# Patient Record
Sex: Female | Born: 1955 | Race: Black or African American | Hispanic: No | Marital: Married | Smoking: Current every day smoker
Health system: Southern US, Community
[De-identification: ages and names within clinical notes are randomized; demographics above are authoritative.]

## PROBLEM LIST (undated history)

## (undated) DIAGNOSIS — G4700 Insomnia, unspecified: Secondary | ICD-10-CM

## (undated) DIAGNOSIS — I739 Peripheral vascular disease, unspecified: Secondary | ICD-10-CM

## (undated) DIAGNOSIS — L409 Psoriasis, unspecified: Secondary | ICD-10-CM

## (undated) DIAGNOSIS — I10 Essential (primary) hypertension: Secondary | ICD-10-CM

## (undated) DIAGNOSIS — IMO0002 Reserved for concepts with insufficient information to code with codable children: Secondary | ICD-10-CM

## (undated) DIAGNOSIS — K219 Gastro-esophageal reflux disease without esophagitis: Secondary | ICD-10-CM

## (undated) DIAGNOSIS — K254 Chronic or unspecified gastric ulcer with hemorrhage: Secondary | ICD-10-CM

## (undated) DIAGNOSIS — E119 Type 2 diabetes mellitus without complications: Secondary | ICD-10-CM

## (undated) DIAGNOSIS — Z227 Latent tuberculosis: Secondary | ICD-10-CM

## (undated) DIAGNOSIS — N920 Excessive and frequent menstruation with regular cycle: Secondary | ICD-10-CM

## (undated) DIAGNOSIS — I639 Cerebral infarction, unspecified: Secondary | ICD-10-CM

## (undated) DIAGNOSIS — E785 Hyperlipidemia, unspecified: Secondary | ICD-10-CM

## (undated) DIAGNOSIS — D649 Anemia, unspecified: Secondary | ICD-10-CM

## (undated) DIAGNOSIS — M1990 Unspecified osteoarthritis, unspecified site: Secondary | ICD-10-CM

## (undated) HISTORY — DX: Reserved for concepts with insufficient information to code with codable children: IMO0002

## (undated) HISTORY — DX: Gastro-esophageal reflux disease without esophagitis: K21.9

## (undated) HISTORY — DX: Peripheral vascular disease, unspecified: I73.9

## (undated) HISTORY — DX: Psoriasis, unspecified: L40.9

## (undated) HISTORY — DX: Chronic or unspecified gastric ulcer with hemorrhage: K25.4

## (undated) HISTORY — DX: Insomnia, unspecified: G47.00

## (undated) HISTORY — PX: KIDNEY SURGERY: SHX687

## (undated) HISTORY — PX: TUBAL LIGATION: SHX77

## (undated) HISTORY — DX: Unspecified osteoarthritis, unspecified site: M19.90

## (undated) HISTORY — DX: Cerebral infarction, unspecified: I63.9

## (undated) HISTORY — DX: Latent tuberculosis: Z22.7

## (undated) HISTORY — DX: Hyperlipidemia, unspecified: E78.5

## (undated) HISTORY — DX: Excessive and frequent menstruation with regular cycle: N92.0

## (undated) HISTORY — DX: Anemia, unspecified: D64.9

---

## 1981-10-24 DIAGNOSIS — IMO0002 Reserved for concepts with insufficient information to code with codable children: Secondary | ICD-10-CM

## 1981-10-24 DIAGNOSIS — R87619 Unspecified abnormal cytological findings in specimens from cervix uteri: Secondary | ICD-10-CM

## 1981-10-24 HISTORY — DX: Reserved for concepts with insufficient information to code with codable children: IMO0002

## 1981-10-24 HISTORY — DX: Unspecified abnormal cytological findings in specimens from cervix uteri: R87.619

## 1998-11-13 ENCOUNTER — Other Ambulatory Visit: Admission: RE | Admit: 1998-11-13 | Discharge: 1998-11-13 | Payer: Self-pay | Admitting: Obstetrics and Gynecology

## 1998-11-24 ENCOUNTER — Encounter: Payer: Self-pay | Admitting: Obstetrics and Gynecology

## 1998-11-24 ENCOUNTER — Ambulatory Visit (HOSPITAL_COMMUNITY): Admission: RE | Admit: 1998-11-24 | Discharge: 1998-11-24 | Payer: Self-pay | Admitting: Obstetrics and Gynecology

## 2000-06-01 ENCOUNTER — Other Ambulatory Visit: Admission: RE | Admit: 2000-06-01 | Discharge: 2000-06-01 | Payer: Self-pay | Admitting: Obstetrics and Gynecology

## 2000-06-14 ENCOUNTER — Ambulatory Visit (HOSPITAL_COMMUNITY): Admission: RE | Admit: 2000-06-14 | Discharge: 2000-06-14 | Payer: Self-pay | Admitting: Obstetrics and Gynecology

## 2000-06-14 ENCOUNTER — Encounter: Payer: Self-pay | Admitting: Obstetrics and Gynecology

## 2000-06-29 ENCOUNTER — Encounter: Payer: Self-pay | Admitting: Obstetrics and Gynecology

## 2000-06-29 ENCOUNTER — Ambulatory Visit (HOSPITAL_COMMUNITY): Admission: RE | Admit: 2000-06-29 | Discharge: 2000-06-29 | Payer: Self-pay | Admitting: Obstetrics and Gynecology

## 2000-10-10 ENCOUNTER — Encounter: Payer: Self-pay | Admitting: Obstetrics and Gynecology

## 2000-10-10 ENCOUNTER — Ambulatory Visit (HOSPITAL_COMMUNITY): Admission: RE | Admit: 2000-10-10 | Discharge: 2000-10-10 | Payer: Self-pay | Admitting: Obstetrics and Gynecology

## 2000-11-26 ENCOUNTER — Encounter: Payer: Self-pay | Admitting: Emergency Medicine

## 2000-11-26 ENCOUNTER — Emergency Department (HOSPITAL_COMMUNITY): Admission: EM | Admit: 2000-11-26 | Discharge: 2000-11-26 | Payer: Self-pay | Admitting: Emergency Medicine

## 2000-11-30 ENCOUNTER — Ambulatory Visit (HOSPITAL_COMMUNITY): Admission: RE | Admit: 2000-11-30 | Discharge: 2000-11-30 | Payer: Self-pay | Admitting: Internal Medicine

## 2000-11-30 ENCOUNTER — Encounter (HOSPITAL_BASED_OUTPATIENT_CLINIC_OR_DEPARTMENT_OTHER): Payer: Self-pay | Admitting: Internal Medicine

## 2001-06-07 ENCOUNTER — Other Ambulatory Visit: Admission: RE | Admit: 2001-06-07 | Discharge: 2001-06-07 | Payer: Self-pay | Admitting: Obstetrics and Gynecology

## 2001-07-03 ENCOUNTER — Encounter: Payer: Self-pay | Admitting: Obstetrics and Gynecology

## 2001-07-03 ENCOUNTER — Ambulatory Visit (HOSPITAL_COMMUNITY): Admission: RE | Admit: 2001-07-03 | Discharge: 2001-07-03 | Payer: Self-pay | Admitting: Obstetrics and Gynecology

## 2002-08-13 ENCOUNTER — Other Ambulatory Visit: Admission: RE | Admit: 2002-08-13 | Discharge: 2002-08-13 | Payer: Self-pay | Admitting: Obstetrics and Gynecology

## 2002-08-20 ENCOUNTER — Encounter: Payer: Self-pay | Admitting: Obstetrics and Gynecology

## 2002-08-20 ENCOUNTER — Ambulatory Visit (HOSPITAL_COMMUNITY): Admission: RE | Admit: 2002-08-20 | Discharge: 2002-08-20 | Payer: Self-pay | Admitting: Obstetrics and Gynecology

## 2003-09-03 ENCOUNTER — Other Ambulatory Visit: Admission: RE | Admit: 2003-09-03 | Discharge: 2003-09-03 | Payer: Self-pay | Admitting: Obstetrics and Gynecology

## 2003-09-09 ENCOUNTER — Ambulatory Visit (HOSPITAL_COMMUNITY): Admission: RE | Admit: 2003-09-09 | Discharge: 2003-09-09 | Payer: Self-pay | Admitting: Obstetrics and Gynecology

## 2004-09-08 ENCOUNTER — Other Ambulatory Visit: Admission: RE | Admit: 2004-09-08 | Discharge: 2004-09-08 | Payer: Self-pay | Admitting: Obstetrics and Gynecology

## 2004-09-27 ENCOUNTER — Ambulatory Visit (HOSPITAL_COMMUNITY): Admission: RE | Admit: 2004-09-27 | Discharge: 2004-09-27 | Payer: Self-pay | Admitting: Obstetrics and Gynecology

## 2005-08-11 ENCOUNTER — Other Ambulatory Visit: Admission: RE | Admit: 2005-08-11 | Discharge: 2005-08-11 | Payer: Self-pay | Admitting: Obstetrics and Gynecology

## 2005-10-24 HISTORY — PX: COLONOSCOPY: SHX174

## 2005-11-01 ENCOUNTER — Ambulatory Visit (HOSPITAL_COMMUNITY): Admission: RE | Admit: 2005-11-01 | Discharge: 2005-11-01 | Payer: Self-pay | Admitting: Obstetrics and Gynecology

## 2005-11-29 ENCOUNTER — Emergency Department (HOSPITAL_COMMUNITY): Admission: EM | Admit: 2005-11-29 | Discharge: 2005-11-30 | Payer: Self-pay | Admitting: Emergency Medicine

## 2005-12-01 ENCOUNTER — Ambulatory Visit: Payer: Self-pay | Admitting: Internal Medicine

## 2005-12-05 ENCOUNTER — Ambulatory Visit: Payer: Self-pay | Admitting: Internal Medicine

## 2005-12-05 ENCOUNTER — Encounter (INDEPENDENT_AMBULATORY_CARE_PROVIDER_SITE_OTHER): Payer: Self-pay | Admitting: *Deleted

## 2005-12-05 DIAGNOSIS — K648 Other hemorrhoids: Secondary | ICD-10-CM | POA: Insufficient documentation

## 2006-01-06 ENCOUNTER — Ambulatory Visit: Payer: Self-pay | Admitting: Internal Medicine

## 2006-11-28 ENCOUNTER — Ambulatory Visit (HOSPITAL_COMMUNITY): Admission: RE | Admit: 2006-11-28 | Discharge: 2006-11-28 | Payer: Self-pay | Admitting: Obstetrics and Gynecology

## 2006-11-29 ENCOUNTER — Ambulatory Visit (HOSPITAL_COMMUNITY): Admission: RE | Admit: 2006-11-29 | Discharge: 2006-11-29 | Payer: Self-pay | Admitting: Obstetrics and Gynecology

## 2007-08-22 ENCOUNTER — Observation Stay (HOSPITAL_COMMUNITY): Admission: EM | Admit: 2007-08-22 | Discharge: 2007-08-23 | Payer: Self-pay | Admitting: Emergency Medicine

## 2008-01-07 ENCOUNTER — Ambulatory Visit (HOSPITAL_COMMUNITY): Admission: RE | Admit: 2008-01-07 | Discharge: 2008-01-07 | Payer: Self-pay | Admitting: Obstetrics and Gynecology

## 2008-03-28 ENCOUNTER — Encounter: Payer: Self-pay | Admitting: Internal Medicine

## 2008-03-28 LAB — CONVERTED CEMR LAB
ALT: 7 units/L
AST: 13 units/L
BUN: 16 mg/dL
HCT: 27 %
Hemoglobin: 8.8 g/dL
MCV: 79 fL
Platelets: 432 10*3/uL
WBC: 5.9 10*3/uL

## 2008-04-03 ENCOUNTER — Encounter: Payer: Self-pay | Admitting: Internal Medicine

## 2008-04-03 LAB — CONVERTED CEMR LAB: Hemoglobin: 8.8 g/dL

## 2008-05-01 ENCOUNTER — Ambulatory Visit: Payer: Self-pay | Admitting: Internal Medicine

## 2008-05-01 DIAGNOSIS — D509 Iron deficiency anemia, unspecified: Secondary | ICD-10-CM | POA: Insufficient documentation

## 2008-05-06 ENCOUNTER — Telehealth: Payer: Self-pay | Admitting: Internal Medicine

## 2008-05-08 ENCOUNTER — Ambulatory Visit: Payer: Self-pay | Admitting: Internal Medicine

## 2009-01-14 ENCOUNTER — Ambulatory Visit (HOSPITAL_COMMUNITY): Admission: RE | Admit: 2009-01-14 | Discharge: 2009-01-14 | Payer: Self-pay | Admitting: Obstetrics and Gynecology

## 2010-01-07 ENCOUNTER — Ambulatory Visit: Payer: Self-pay | Admitting: Vascular Surgery

## 2010-01-28 ENCOUNTER — Ambulatory Visit (HOSPITAL_COMMUNITY): Admission: RE | Admit: 2010-01-28 | Discharge: 2010-01-28 | Payer: Self-pay | Admitting: Obstetrics and Gynecology

## 2011-01-22 ENCOUNTER — Other Ambulatory Visit: Payer: Self-pay | Admitting: Family Medicine

## 2011-02-03 ENCOUNTER — Other Ambulatory Visit (HOSPITAL_COMMUNITY): Payer: Self-pay | Admitting: Obstetrics and Gynecology

## 2011-02-03 DIAGNOSIS — Z1231 Encounter for screening mammogram for malignant neoplasm of breast: Secondary | ICD-10-CM

## 2011-02-09 ENCOUNTER — Ambulatory Visit (HOSPITAL_COMMUNITY)
Admission: RE | Admit: 2011-02-09 | Discharge: 2011-02-09 | Disposition: A | Discharge status: Home / Self Care | Payer: BLUE CROSS/BLUE SHIELD | Source: Ambulatory Visit | Attending: Obstetrics and Gynecology | Admitting: Obstetrics and Gynecology

## 2011-02-09 DIAGNOSIS — Z1231 Encounter for screening mammogram for malignant neoplasm of breast: Secondary | ICD-10-CM | POA: Insufficient documentation

## 2011-03-08 NOTE — Procedures (Signed)
DUPLEX DEEP VENOUS EXAM - LOWER EXTREMITY   INDICATION:  Left lower extremity pain.   HISTORY:  Edema:  Yes.  Trauma/Surgery:  No.  Pain:  No.  PE:  Previous DVT:  No.  Anticoagulants:  Heparin.  Other:   DUPLEX EXAM:                CFV   SFV   PopV  PTV    GSV                R  L  R  L  R  L  R   L  R  L  Thrombosis    o  o     o     o      o     o  Spontaneous   +  +     +     +      +     +  Phasic        +  +     +     +      +     +  Augmentation  +  +     +     +      +     +  Compressible  +  +     +     +      +     +  Competent     +  +     +     +      +     +   Legend:  + - yes  o - no  p - partial  D - decreased   IMPRESSION:  No evidence of deep venous thrombosis identified.  Noncompressible mid small saphenous vein with partial blood flow  identified.    _____________________________  Kristi Avery. Edilia Bo, M.D.   CJ/MEDQ  D:  01/07/2010  T:  01/07/2010  Job:  [id number]

## 2011-03-08 NOTE — Discharge Summary (Signed)
NAME:  Kristi Avery, Kristi Avery                  ACCOUNT NO.:  [id number]   MEDICAL RECORD NO.:  [id number]          PATIENT TYPE:  INP   LOCATION:  4731                         FACILITY:  [facility]   PHYSICIAN:  Vesta Mixer, M.D. DATE OF BIRTH:  1956-02-11   DATE OF ADMISSION:  08/22/2007  DATE OF DISCHARGE:  08/23/2007                               DISCHARGE SUMMARY   DISCHARGE DIAGNOSES:  1. Chest pain - most likely noncardiac.  2. Diabetes mellitus.  3. Hyperlipidemia.  4. Hypertension.   DISCHARGE MEDICATIONS:  1. Enteric-coated aspirin 81 mg a day.  2 . Avandia 4 mg a day.  1. Glucotrol XL 10 mg a day.  2. Simvastatin 20 mg a day.  3. Monopril 40 mg a day.  4. Hydrochlorothiazide 25 mg a day.  5. Prilosec over-the-counter once a day.  6. Metformin 500 mg a day to start on Friday.  7. Nitroglycerin 0.4 mg as needed for chest pain.   DISPOSITION:  The patient will see Dr. Elease Hashimoto in the office in 1-2  weeks.  We will schedule her for either outpatient repeat Cardiolite  study versus heart catheterization depending on her symptoms.  She had  been instructed to call me for further episodes of chest pain.   HISTORY:  [name] is a 55 year old female with a long history of  diabetes mellitus, hypertension, hyperlipidemia.  She is admitted  through the emergency room after having episodes of chest pain.   HOSPITAL COURSE:  Per problems.  #1 - CHEST PAIN.  The patient presented with some typical and some  atypical episodes of chest pain.  She has sharp episodes of chest pain  that only last for a second or so.  She has had occasional episodes of  chest tightness that last for a little bit longer time.  She is somewhat  vague.  It sounds like some of these certainly could be angina.  She had  no EKG changes.  She ruled out for myocardial infarction with serial CPK  and troponin levels.  The patient is quite stable and would like to go  home.  We will discharge her to home today.  We have  added  nitroglycerin.  We have held her Glucophage, but we will restart that  tomorrow.  The patient had a spiral CT which was reportedly  negative, although I am not able to find the results on the computer.  We will follow up with this as an outpatient.  #2 - Hyper lipidemia - stable.  #3 - Diabetes mellitus - stable.           ______________________________  Vesta Mixer, M.D.     PJN/MEDQ  D:  08/23/2007  T:  08/23/2007  Job:  [id number]   cc:   Gwen Pounds, MD

## 2011-03-08 NOTE — H&P (Signed)
NAME:  Kristi, Avery                  ACCOUNT NO.:  [id number]   MEDICAL RECORD NO.:  [id number]          PATIENT TYPE:  INP   LOCATION:  4731                         FACILITY:  [facility]   PHYSICIAN:  Vesta Mixer, M.D. DATE OF BIRTH:  [birth year]   DATE OF ADMISSION:  08/22/2007  DATE OF DISCHARGE:                              HISTORY & PHYSICAL   HISTORY OF PRESENT ILLNESS:  Kristi Avery is a 55 year old female with a  history of cigarette smoking, chest pain, diabetes mellitus and  gastroesophageal reflux disease.  She is admitted to the hospital with  episodes of chest pain.   The patient has been seen in our office many years ago.  She had a  stress test approximately 5 years ago which was unremarkable.   The patient has overall done fairly well.  She started having some  episodes of chest pain early this morning.  The chest pain started  originally as a left arm pain and then radiated to her chest.  The  episode lasted about 30 seconds.  It then resolved.  She was up walking  around and she had chest tightness with exertion.  The chest tightness  gradually resolved as she rested.   The patient recalls having some episodes of chest pain over the past  several weeks which were not all that similar to this episode.  The  patient came to the emergency room and received nitroglycerin.  The  chest pain was promptly relieved with sublingual nitroglycerin.   CURRENT MEDICATIONS:  1. Avandia 4 mg a day.  2. Glucotrol XL 10 mg a day.  3. Glucophage 500 mg p.o. b.i.d.  4. Zocor 20 mg a day.  5. Fosinopril 40 mg a day.  6. HCTZ 25 mg a day.  7. Protonix 40 mg a day.   ALLERGIES:  No known drug allergies.   PAST MEDICAL HISTORY:  1. Diabetes mellitus.  2. Chest pain.  3. Hypertension.  4. Hyperlipidemia.   SOCIAL HISTORY:  The patient smokes about a half pack of cigarettes a  day.  She drinks alcohol occasionally.  She does not get any regular  exercise.   FAMILY HISTORY:   Unremarkable.   REVIEW OF SYSTEMS:  Reviewed and is essentially negative except for as  noted in the HPI.   PHYSICAL EXAMINATION:  GENERAL:  She is a middle-aged female in no acute  distress.  She is alert and oriented x3 and her mood and affect are  normal.  VITAL SIGNS:  Blood pressure is 151/66 with heart rate 75.  NECK:  2+ carotids.  She has no bruits, no JVD, no thyromegaly.  LUNGS:  Clear to auscultation.  HEART:  Regular rate, S1, S2.  ABDOMEN:  Good bowel sounds, nontender.  EXTREMITIES:  She has no clubbing, cyanosis or edema.  NEUROLOGIC:  Nonfocal.   LABORATORY DATA AND X-RAY FINDINGS:  The patient's initial cardiac  enzymes are negative.  Her initial labs are otherwise unremarkable.  She  has a D-dimer of 0.55.  The spiral CT of the lungs is pending.  Creatinine is  1.5.   ASSESSMENT/PLAN:  Kristi Avery presents with some episodes of chest pain.  She has some atypical and typical symptoms of angina.  We will continue  to collect cardiac enzymes.  I would have a low threshold to perform  heart catheterization since she does have exertional chest tightness.  We will discuss these results more with the patient tonight.  All of her  other medical problems remain stable.           ______________________________  Vesta Mixer, M.D.     PJN/MEDQ  D:  08/22/2007  T:  08/23/2007  Job:  [id number]   cc:   Gwen Pounds, MD

## 2011-03-17 ENCOUNTER — Emergency Department (HOSPITAL_COMMUNITY): Payer: BC Managed Care – PPO

## 2011-03-17 ENCOUNTER — Emergency Department (HOSPITAL_COMMUNITY)
Admission: EM | Admit: 2011-03-17 | Discharge: 2011-03-18 | Disposition: A | Discharge status: Home / Self Care | Payer: BC Managed Care – PPO | Attending: Emergency Medicine | Admitting: Emergency Medicine

## 2011-03-17 DIAGNOSIS — K219 Gastro-esophageal reflux disease without esophagitis: Secondary | ICD-10-CM | POA: Insufficient documentation

## 2011-03-17 DIAGNOSIS — R0789 Other chest pain: Secondary | ICD-10-CM | POA: Insufficient documentation

## 2011-03-17 DIAGNOSIS — E119 Type 2 diabetes mellitus without complications: Secondary | ICD-10-CM | POA: Insufficient documentation

## 2011-03-17 DIAGNOSIS — R51 Headache: Secondary | ICD-10-CM | POA: Insufficient documentation

## 2011-03-17 DIAGNOSIS — I10 Essential (primary) hypertension: Secondary | ICD-10-CM | POA: Insufficient documentation

## 2011-03-17 LAB — GLUCOSE, CAPILLARY: Glucose-Capillary: 129 mg/dL — ABNORMAL HIGH (ref 70–99)

## 2011-03-18 LAB — CK TOTAL AND CKMB (NOT AT ARMC)
CK, MB: 1.3 ng/mL (ref 0.3–4.0)
Relative Index: INVALID (ref 0.0–2.5)
Total CK: 53 U/L (ref 7–177)

## 2011-03-18 LAB — DIFFERENTIAL: Basophils Relative: 1 % (ref 0–1)

## 2011-03-18 LAB — CBC
HCT: 33.9 % — ABNORMAL LOW (ref 36.0–46.0)
Hemoglobin: 12 g/dL (ref 12.0–15.0)
MCH: 30.8 pg (ref 26.0–34.0)
MCV: 87.1 fL (ref 78.0–100.0)
RBC: 3.89 MIL/uL (ref 3.87–5.11)
WBC: 7 10*3/uL (ref 4.0–10.5)

## 2011-03-18 LAB — BASIC METABOLIC PANEL
CO2: 24 mEq/L (ref 19–32)
Calcium: 9.3 mg/dL (ref 8.4–10.5)
Chloride: 103 mEq/L (ref 96–112)
GFR calc Af Amer: 53 mL/min — ABNORMAL LOW (ref >60)
GFR calc non Af Amer: 43 mL/min — ABNORMAL LOW (ref >60)
Potassium: 4.2 mEq/L (ref 3.5–5.1)
Sodium: 136 mEq/L (ref 135–145)

## 2011-03-18 LAB — TROPONIN I: Troponin I: <0.3 ng/mL (ref <0.30)

## 2011-04-01 ENCOUNTER — Other Ambulatory Visit: Payer: Self-pay | Admitting: Family Medicine

## 2011-08-03 LAB — I-STAT 8, (EC8 V) (CONVERTED LAB)
Acid-base deficit: 4 — ABNORMAL HIGH
BUN: 23
Bicarbonate: 22.5
Chloride: 109
Glucose, Bld: 105 — ABNORMAL HIGH
HCT: 35 — ABNORMAL LOW
Hemoglobin: 11.9 — ABNORMAL LOW
Operator id: 198171
Potassium: 4.8
Sodium: 138
TCO2: 24
pCO2, Ven: 47.1
pH, Ven: 7.288

## 2011-08-03 LAB — DIFFERENTIAL
Basophils Absolute: 0.1
Basophils Relative: 1
Eosinophils Absolute: 0.1
Eosinophils Relative: 1
Lymphocytes Relative: 41
Lymphs Abs: 2.3
Monocytes Absolute: 0.4
Monocytes Relative: 8
Neutro Abs: 2.8
Neutrophils Relative %: 49

## 2011-08-03 LAB — CARDIAC PANEL(CRET KIN+CKTOT+MB+TROPI)
CK, MB: 0.8
CK, MB: 1.1
Total CK: 51
Troponin I: 0.01
Troponin I: 0.02

## 2011-08-03 LAB — POCT I-STAT CREATININE
Creatinine, Ser: 1.5 — ABNORMAL HIGH
Operator id: 198171

## 2011-08-03 LAB — COMPREHENSIVE METABOLIC PANEL
AST: 12
Albumin: 3.4 — ABNORMAL LOW
Calcium: 8.7
Chloride: 107
Creatinine, Ser: 1.26 — ABNORMAL HIGH
GFR calc Af Amer: 54 — ABNORMAL LOW
Total Bilirubin: 0.4

## 2011-08-03 LAB — CBC
HCT: 33.7 — ABNORMAL LOW
MCV: 87.2
RBC: 3.87
WBC: 5.6

## 2011-08-03 LAB — POCT CARDIAC MARKERS
CKMB, poc: <1 — ABNORMAL LOW
Myoglobin, poc: 42.6
Myoglobin, poc: 64.9
Operator id: 198171
Operator id: 198171
Troponin i, poc: <0.05
Troponin i, poc: <0.05

## 2011-08-03 LAB — D-DIMER, QUANTITATIVE: D-Dimer, Quant: 0.55 — ABNORMAL HIGH

## 2012-02-08 ENCOUNTER — Ambulatory Visit: Payer: Self-pay | Admitting: Obstetrics and Gynecology

## 2012-02-28 ENCOUNTER — Ambulatory Visit: Payer: Self-pay | Admitting: Obstetrics and Gynecology

## 2012-02-29 ENCOUNTER — Other Ambulatory Visit: Payer: Self-pay | Admitting: Obstetrics and Gynecology

## 2012-02-29 DIAGNOSIS — Z1231 Encounter for screening mammogram for malignant neoplasm of breast: Secondary | ICD-10-CM

## 2012-03-26 ENCOUNTER — Encounter: Payer: Self-pay | Admitting: Obstetrics and Gynecology

## 2012-03-28 ENCOUNTER — Encounter: Payer: Self-pay | Admitting: Obstetrics and Gynecology

## 2012-03-28 ENCOUNTER — Ambulatory Visit (HOSPITAL_COMMUNITY): Payer: BC Managed Care – PPO

## 2012-03-28 ENCOUNTER — Ambulatory Visit (INDEPENDENT_AMBULATORY_CARE_PROVIDER_SITE_OTHER): Payer: BC Managed Care – PPO | Admitting: Obstetrics and Gynecology

## 2012-03-28 VITALS — BP 122/64 | Resp 16 | Ht 61.0 in | Wt 175.0 lb

## 2012-03-28 DIAGNOSIS — I10 Essential (primary) hypertension: Secondary | ICD-10-CM | POA: Insufficient documentation

## 2012-03-28 DIAGNOSIS — L408 Other psoriasis: Secondary | ICD-10-CM

## 2012-03-28 DIAGNOSIS — E119 Type 2 diabetes mellitus without complications: Secondary | ICD-10-CM | POA: Insufficient documentation

## 2012-03-28 DIAGNOSIS — L409 Psoriasis, unspecified: Secondary | ICD-10-CM | POA: Insufficient documentation

## 2012-03-28 DIAGNOSIS — D219 Benign neoplasm of connective and other soft tissue, unspecified: Secondary | ICD-10-CM | POA: Insufficient documentation

## 2012-03-28 DIAGNOSIS — N951 Menopausal and female climacteric states: Secondary | ICD-10-CM

## 2012-03-28 DIAGNOSIS — Z01419 Encounter for gynecological examination (general) (routine) without abnormal findings: Secondary | ICD-10-CM

## 2012-03-28 DIAGNOSIS — D259 Leiomyoma of uterus, unspecified: Secondary | ICD-10-CM

## 2012-03-28 DIAGNOSIS — K219 Gastro-esophageal reflux disease without esophagitis: Secondary | ICD-10-CM | POA: Insufficient documentation

## 2012-03-28 DIAGNOSIS — G4700 Insomnia, unspecified: Secondary | ICD-10-CM

## 2012-03-28 DIAGNOSIS — E785 Hyperlipidemia, unspecified: Secondary | ICD-10-CM | POA: Insufficient documentation

## 2012-03-28 MED ORDER — CLOBETASOL PROPIONATE 0.05 % EX CREA: TOPICAL_CREAM | CUTANEOUS | Status: DC

## 2012-03-28 MED ORDER — TRIAMCINOLONE ACETONIDE 0.1 % EX CREA: TOPICAL_CREAM | CUTANEOUS | Status: DC

## 2012-03-28 NOTE — Progress Notes (Signed)
Last Pap: per pt 2012 , but pap in chart is dated 2011  WNL: Yes Regular Periods:no Contraception: Per pt None  Monthly Breast exam:yes Tetanus<46yrs:yes Nl.Bladder Function:yes Daily BMs:yes Healthy Diet:yes Calcium:yes Mammogram:yes Last mammogram 02/09/2011 Mammogram is set up for 04/09/12 Exercise:no Seatbelt: yes Abuse at home: no Stressful work:no Sigmoid-colonoscopy: per pt 5 years ago "2008" Bone Density: No  Subjective:    Kristi Avery is a 56 y.o. female G2P2002 who presents for annual exam.  She has a new diagnosis of psoriasis, for which she has been recommended methotrexate that is undecided about that therapy. She is currently using topical clobetasol and triamcinolone with some relief of itching. She states that her diabetes has been well controlled.  The following portions of the patient's history were reviewed and updated as appropriate: allergies, current medications, past family history, past medical history, past social history, past surgical history and problem list.  Review of Systems Pertinent items are noted in HPI. Gastrointestinal:No change in bowel habits, no abdominal pain, no rectal bleeding Genitourinary:negative for dysuria, frequency, hematuria, nocturia and urinary incontinence    Objective:     BP 122/64  Resp 16  Ht 5\' 1"  (1.549 m)  Wt 175 lb (79.379 kg)  BMI 33.07 kg/m2  Weight:  Wt Readings from Last 1 Encounters:  03/28/12 175 lb (79.379 kg)     BMI: Body mass index is 33.07 kg/(m^2). General Appearance: Alert, appropriate appearance for age. No acute distress. Psoriatic lesions over the lower extremities are varying sizes HEENT: Grossly normal Neck / Thyroid: Supple, no masses, nodes or enlargement Lungs: clear to auscultation bilaterally Back: No CVA tenderness Breast Exam: No masses or nodes.No dimpling, nipple retraction or discharge. Cardiovascular: Regular rate and rhythm. S1, S2, no murmur Gastrointestinal: Soft, non-tender,  no masses or organomegaly Pelvic Exam: External genitalia: normal general appearance Vaginal: normal mucosa without prolapse or lesions Cervix: normal appearance Adnexa: no separate masses Uterus: upper limits of normal size mobile and nontender Rectovaginal: normal rectal, no masses Lymphatic Exam: Non-palpable nodes in neck, clavicular, axillary, or inguinal regions Skin: see above Neurologic: Normal gait and speech, no tremor  Psychiatric: Alert and oriented, appropriate affect.    Urinalysis:Not done      Assessment:    menopausal vasomotor symptoms have recurred with the warm whether  Staple asymptomatic uterine fibroid New diagnosis of psoriasis primarily on the lower extremity   Plan:   Continue current regimen of clobetasol and triamcinolone Followup as scheduled with her dermatologist in the fall to determine whether she will use methotrexate therapy Pap smear is due in 2014 Follow-up:  for annual exam

## 2012-04-09 ENCOUNTER — Ambulatory Visit (HOSPITAL_COMMUNITY)
Admission: RE | Admit: 2012-04-09 | Discharge: 2012-04-09 | Disposition: A | Discharge status: Home / Self Care | Payer: BC Managed Care – PPO | Source: Ambulatory Visit | Attending: Obstetrics and Gynecology | Admitting: Obstetrics and Gynecology

## 2012-04-09 DIAGNOSIS — Z1231 Encounter for screening mammogram for malignant neoplasm of breast: Secondary | ICD-10-CM | POA: Insufficient documentation

## 2012-04-11 ENCOUNTER — Other Ambulatory Visit: Payer: Self-pay | Admitting: Obstetrics and Gynecology

## 2012-04-11 DIAGNOSIS — R928 Other abnormal and inconclusive findings on diagnostic imaging of breast: Secondary | ICD-10-CM

## 2012-04-17 ENCOUNTER — Ambulatory Visit
Admission: RE | Admit: 2012-04-17 | Discharge: 2012-04-17 | Disposition: A | Discharge status: Home / Self Care | Payer: BC Managed Care – PPO | Source: Ambulatory Visit | Attending: Obstetrics and Gynecology | Admitting: Obstetrics and Gynecology

## 2012-04-17 DIAGNOSIS — R928 Other abnormal and inconclusive findings on diagnostic imaging of breast: Secondary | ICD-10-CM

## 2012-05-15 ENCOUNTER — Other Ambulatory Visit: Payer: Self-pay | Admitting: Family Medicine

## 2012-05-15 ENCOUNTER — Ambulatory Visit
Admission: RE | Admit: 2012-05-15 | Discharge: 2012-05-15 | Disposition: A | Discharge status: Home / Self Care | Payer: No Typology Code available for payment source | Source: Ambulatory Visit | Attending: Family Medicine | Admitting: Family Medicine

## 2012-05-15 DIAGNOSIS — L409 Psoriasis, unspecified: Secondary | ICD-10-CM

## 2012-05-17 ENCOUNTER — Encounter: Payer: Self-pay | Admitting: Obstetrics and Gynecology

## 2012-08-31 ENCOUNTER — Encounter (HOSPITAL_COMMUNITY): Payer: Self-pay | Admitting: Emergency Medicine

## 2012-08-31 ENCOUNTER — Inpatient Hospital Stay (HOSPITAL_COMMUNITY)
Admission: EM | Admit: 2012-08-31 | Discharge: 2012-09-02 | DRG: 175 | Disposition: A | Discharge status: Home / Self Care | Payer: BC Managed Care – PPO | Attending: Gastroenterology | Admitting: Gastroenterology

## 2012-08-31 DIAGNOSIS — Z791 Long term (current) use of non-steroidal anti-inflammatories (NSAID): Secondary | ICD-10-CM

## 2012-08-31 DIAGNOSIS — K2960 Other gastritis without bleeding: Secondary | ICD-10-CM | POA: Diagnosis present

## 2012-08-31 DIAGNOSIS — K219 Gastro-esophageal reflux disease without esophagitis: Secondary | ICD-10-CM | POA: Diagnosis present

## 2012-08-31 DIAGNOSIS — K920 Hematemesis: Secondary | ICD-10-CM

## 2012-08-31 DIAGNOSIS — F17200 Nicotine dependence, unspecified, uncomplicated: Secondary | ICD-10-CM | POA: Diagnosis present

## 2012-08-31 DIAGNOSIS — G4700 Insomnia, unspecified: Secondary | ICD-10-CM | POA: Diagnosis present

## 2012-08-31 DIAGNOSIS — K254 Chronic or unspecified gastric ulcer with hemorrhage: Secondary | ICD-10-CM | POA: Diagnosis present

## 2012-08-31 DIAGNOSIS — L408 Other psoriasis: Secondary | ICD-10-CM | POA: Diagnosis present

## 2012-08-31 DIAGNOSIS — I959 Hypotension, unspecified: Secondary | ICD-10-CM

## 2012-08-31 DIAGNOSIS — Z79899 Other long term (current) drug therapy: Secondary | ICD-10-CM

## 2012-08-31 DIAGNOSIS — D62 Acute posthemorrhagic anemia: Secondary | ICD-10-CM | POA: Diagnosis present

## 2012-08-31 DIAGNOSIS — K921 Melena: Secondary | ICD-10-CM | POA: Diagnosis present

## 2012-08-31 DIAGNOSIS — R109 Unspecified abdominal pain: Secondary | ICD-10-CM

## 2012-08-31 DIAGNOSIS — E119 Type 2 diabetes mellitus without complications: Secondary | ICD-10-CM | POA: Diagnosis present

## 2012-08-31 DIAGNOSIS — K250 Acute gastric ulcer with hemorrhage: Principal | ICD-10-CM | POA: Diagnosis present

## 2012-08-31 DIAGNOSIS — I10 Essential (primary) hypertension: Secondary | ICD-10-CM | POA: Diagnosis present

## 2012-08-31 HISTORY — DX: Type 2 diabetes mellitus without complications: E11.9

## 2012-08-31 HISTORY — DX: Essential (primary) hypertension: I10

## 2012-08-31 LAB — CBC
HCT: 18.5 % — ABNORMAL LOW (ref 36.0–46.0)
Hemoglobin: 6.5 g/dL — CL (ref 12.0–15.0)
MCH: 30.7 pg (ref 26.0–34.0)
MCHC: 35.1 g/dL (ref 30.0–36.0)
MCV: 87.3 fL (ref 78.0–100.0)
Platelets: 223 10*3/uL (ref 150–400)
RBC: 2.12 MIL/uL — ABNORMAL LOW (ref 3.87–5.11)
RDW: 12.9 % (ref 11.5–15.5)
WBC: 18.8 10*3/uL — ABNORMAL HIGH (ref 4.0–10.5)

## 2012-08-31 LAB — COMPREHENSIVE METABOLIC PANEL
ALT: 10 U/L (ref 0–35)
AST: 16 U/L (ref 0–37)
Albumin: 2.7 g/dL — ABNORMAL LOW (ref 3.5–5.2)
Alkaline Phosphatase: 44 U/L (ref 39–117)
BUN: 72 mg/dL — ABNORMAL HIGH (ref 6–23)
CO2: 14 mEq/L — ABNORMAL LOW (ref 19–32)
Calcium: 7.5 mg/dL — ABNORMAL LOW (ref 8.4–10.5)
Chloride: 109 mEq/L (ref 96–112)
Creatinine, Ser: 1.5 mg/dL — ABNORMAL HIGH (ref 0.50–1.10)
GFR calc Af Amer: 44 mL/min — ABNORMAL LOW (ref >90)
GFR calc non Af Amer: 38 mL/min — ABNORMAL LOW (ref >90)
Glucose, Bld: 184 mg/dL — ABNORMAL HIGH (ref 70–99)
Potassium: 4.4 mEq/L (ref 3.5–5.1)
Sodium: 137 mEq/L (ref 135–145)
Total Bilirubin: 0.1 mg/dL — ABNORMAL LOW (ref 0.3–1.2)
Total Protein: 5 g/dL — ABNORMAL LOW (ref 6.0–8.3)

## 2012-08-31 LAB — POCT I-STAT, CHEM 8
BUN: 83 mg/dL — ABNORMAL HIGH (ref 6–23)
Calcium, Ion: 1.09 mmol/L — ABNORMAL LOW (ref 1.12–1.23)
Chloride: 113 mEq/L — ABNORMAL HIGH (ref 96–112)
Creatinine, Ser: 1.4 mg/dL — ABNORMAL HIGH (ref 0.50–1.10)
Glucose, Bld: 174 mg/dL — ABNORMAL HIGH (ref 70–99)
HCT: 17 % — ABNORMAL LOW (ref 36.0–46.0)
Hemoglobin: 5.8 g/dL — CL (ref 12.0–15.0)
Potassium: 4.5 mEq/L (ref 3.5–5.1)
Sodium: 141 mEq/L (ref 135–145)
TCO2: 13 mmol/L (ref 0–100)

## 2012-08-31 LAB — ABO/RH: ABO/RH(D): O POS

## 2012-08-31 LAB — PROTIME-INR
INR: 1.27 (ref 0.00–1.49)
Prothrombin Time: 15.6 seconds — ABNORMAL HIGH (ref 11.6–15.2)

## 2012-08-31 LAB — PREPARE RBC (CROSSMATCH)

## 2012-08-31 MED ORDER — SODIUM CHLORIDE 0.9 % IV SOLN
8.0000 mg/h | INTRAVENOUS | Status: DC
  Administered 2012-08-31 – 2012-09-02 (×2): 8 mg/h via INTRAVENOUS
  Filled 2012-08-31 (×8): qty 80

## 2012-08-31 MED ORDER — FENTANYL CITRATE 0.05 MG/ML IJ SOLN
50.0000 ug | Freq: Once | INTRAMUSCULAR | Status: AC
  Administered 2012-08-31: 50 ug via INTRAVENOUS
  Filled 2012-08-31: qty 2

## 2012-08-31 MED ORDER — ONDANSETRON HCL 4 MG/2ML IJ SOLN: 4.0000 mg | Freq: Four times a day (QID) | INTRAMUSCULAR | Status: DC | PRN

## 2012-08-31 MED ORDER — HYOSCYAMINE SULFATE 0.125 MG PO TABS
0.3750 mg | ORAL_TABLET | Freq: Two times a day (BID) | ORAL | Status: DC
  Filled 2012-08-31 (×3): qty 3

## 2012-08-31 MED ORDER — ONDANSETRON HCL 4 MG PO TABS: 4.0000 mg | ORAL_TABLET | Freq: Four times a day (QID) | ORAL | Status: DC | PRN

## 2012-08-31 MED ORDER — SODIUM CHLORIDE 0.9 % IV SOLN
80.0000 mg | Freq: Once | INTRAVENOUS | Status: AC
  Administered 2012-08-31: 80 mg via INTRAVENOUS
  Filled 2012-08-31: qty 80

## 2012-08-31 MED ORDER — METFORMIN HCL 500 MG PO TABS: 500.0000 mg | ORAL_TABLET | Freq: Two times a day (BID) | ORAL | Status: DC

## 2012-08-31 MED ORDER — KCL IN DEXTROSE-NACL 20-5-0.45 MEQ/L-%-% IV SOLN
INTRAVENOUS | Status: DC
  Administered 2012-09-01 – 2012-09-02 (×3): via INTRAVENOUS
  Filled 2012-08-31 (×7): qty 1000

## 2012-08-31 MED ORDER — LISINOPRIL 20 MG PO TABS
20.0000 mg | ORAL_TABLET | Freq: Every day | ORAL | Status: DC
  Administered 2012-09-01: 20 mg via ORAL
  Filled 2012-08-31: qty 1

## 2012-08-31 MED ORDER — HYOSCYAMINE SULFATE ER 0.375 MG PO TB12
0.3750 mg | ORAL_TABLET | Freq: Two times a day (BID) | ORAL | Status: DC
  Administered 2012-08-31 – 2012-09-02 (×4): 0.375 mg via ORAL
  Filled 2012-08-31 (×5): qty 1

## 2012-08-31 MED ORDER — ONDANSETRON HCL 4 MG/2ML IJ SOLN
4.0000 mg | Freq: Once | INTRAMUSCULAR | Status: AC
  Administered 2012-08-31: 4 mg via INTRAVENOUS
  Filled 2012-08-31: qty 2

## 2012-08-31 MED ORDER — SODIUM CHLORIDE 0.9 % IV BOLUS (SEPSIS)
2000.0000 mL | Freq: Once | INTRAVENOUS | Status: AC
  Administered 2012-08-31: 2000 mL via INTRAVENOUS

## 2012-08-31 MED ORDER — HYDROCHLOROTHIAZIDE 25 MG PO TABS
25.0000 mg | ORAL_TABLET | Freq: Every day | ORAL | Status: DC
  Administered 2012-09-01 – 2012-09-02 (×2): 25 mg via ORAL
  Filled 2012-08-31 (×2): qty 1

## 2012-08-31 MED ORDER — ZOLPIDEM TARTRATE 5 MG PO TABS
5.0000 mg | ORAL_TABLET | Freq: Every evening | ORAL | Status: DC | PRN
  Administered 2012-09-01: 5 mg via ORAL
  Filled 2012-08-31: qty 1

## 2012-08-31 NOTE — ED Notes (Signed)
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

## 2012-08-31 NOTE — Progress Notes (Signed)
PHARMACIST - PHYSICIAN COMMUNICATION DR: Juleen China CONCERNING:  METFORMIN SAFE ADMINISTRATION POLICY  RECOMMENDATION: Metformin has been placed on DISCONTINUE (rejected order) STATUS and should be reordered only after any of the conditions below are ruled out.  Current safety recommendations include avoiding metformin for a minimum of 48 hours after the patient's exposure to intravenous contrast media.  DESCRIPTION:  The Pharmacy Committee has adopted a policy that restricts the use of metformin in hospitalized patients until all the contraindications to administration have been ruled out. Specific contraindications are: []  Serum creatinine ? 1.5 for males [x]  Serum creatinine ? 1.4 for females []  Shock, acute MI, sepsis, hypoxemia, dehydration []  Planned administration of intravenous iodinated contrast media []  Heart Failure patients with low EF []  Acute or chronic metabolic acidosis (including DKA)     , Loma Messing PharmD Pager #: [phone] 8:07 PM 08/31/2012

## 2012-08-31 NOTE — ED Notes (Signed)
Per EMS, pt from home, reports vomiting blood today.  Pt was hypotensive-received fluids en route.  Upon arrival to the ED, pt is hypotensive-pt is A&O x4.  Pt reports abd pain.

## 2012-08-31 NOTE — ED Provider Notes (Signed)
History    56 year old female presenting with hematemesis. Shortly before arrival patient "was throwing up chunks of blood." Preceding this by about a day patient has been complaining of lightheadedness. Sensation that she may pass out. Some crampy upper abdominal pain. No blood in her stool or melena. No chest pain. Mild shortness of breath. Denies use of blood thinning medication and none noted on her med list. She does have a history of gastroesophageal reflux and is on protonix. Denies history of known liver dysfunction or esophageal varices. Denies significant alcohol use. Per review of records, patient had upper and lower endoscopy a few years ago for evaluation of anemia. The studies were fairly unremarkable.   CSN: [id number]  Arrival date & time 08/31/12  1655   First MD Initiated Contact with Patient 08/31/12 1750      Chief Complaint  Patient presents with  . Abdominal Pain  . Hematemesis    (Consider location/radiation/quality/duration/timing/severity/associated sxs/prior treatment) HPI  Past Medical History  Diagnosis Date  . Abnormal Pap smear 1983  . GERD (gastroesophageal reflux disease)   . Menorrhagia     Hx  . Insomnia   . Psoriasis   . Cirrhosis     Past Surgical History  Procedure Date  . Tubal ligation     No family history on file.  History  Substance Use Topics  . Smoking status: Current Every Day Smoker -- 1.0 packs/day    Types: Cigarettes  . Smokeless tobacco: Never Used  . Alcohol Use: No    OB History    Grav Para Term Preterm Abortions TAB SAB Ect Mult Living   2 2 2       2       Review of Systems   Review of symptoms negative unless otherwise noted in HPI.   Allergies  Ultram and Sulfonamide derivatives  Home Medications   Current Outpatient Rx  Name  Route  Sig  Dispense  Refill  . FOSINOPRIL SODIUM 40 MG PO TABS   Oral   Take 40 mg by mouth daily.         Marland Kitchen HYDROCHLOROTHIAZIDE 25 MG PO TABS   Oral   Take 25 mg by  mouth daily.         Marland Kitchen METFORMIN HCL 500 MG PO TABS   Oral   Take 500 mg by mouth 2 (two) times daily with a meal.         . PANTOPRAZOLE SODIUM 40 MG PO TBEC   Oral   Take 40 mg by mouth daily.         Marland Kitchen SIMVASTATIN 20 MG PO TABS   Oral   Take 20 mg by mouth every evening.           BP 91/71  Pulse 85  Temp 97.9 F (36.6 C) (Oral)  Resp 24  SpO2 99%  Physical Exam  Nursing note and vitals reviewed. Constitutional: She appears well-developed and well-nourished. No distress.  HENT:  Head: Normocephalic and atraumatic.       Dark blood noted at bilateral nares, lips, tongue and posterior pharynx. No obvious source of this noted.  Eyes: Conjunctivae normal are normal. Right eye exhibits no discharge. Left eye exhibits no discharge.  Neck: Neck supple.  Cardiovascular: Normal rate, regular rhythm and normal heart sounds.  Exam reveals no gallop and no friction rub.   No murmur heard. Pulmonary/Chest: Effort normal and breath sounds normal. No respiratory distress.  Abdominal: Soft. She exhibits no distension  and no mass. There is no rebound and no guarding.       Pt points to LLQ as region of pain but not significantly tender. No mass palpated. Does seem distended.  Musculoskeletal: She exhibits no edema and no tenderness.  Neurological: She is alert.  Skin: Skin is warm and dry.  Psychiatric: She has a normal mood and affect. Her behavior is normal. Thought content normal.    ED Course  Procedures (including critical care time)  CRITICAL CARE Performed by: Raeford Razor   Total critical care time: 35 minutes  Critical care time was exclusive of separately billable procedures and treating other patients.  Critical care was necessary to treat or prevent imminent or life-threatening deterioration.  Critical care was time spent personally by me on the following activities: development of treatment plan with patient and/or surrogate as well as nursing,  discussions with consultants, evaluation of patient's response to treatment, examination of patient, obtaining history from patient or surrogate, ordering and performing treatments and interventions, ordering and review of laboratory studies, ordering and review of radiographic studies, pulse oximetry and re-evaluation of patient's condition.   Labs Reviewed  CBC - Abnormal; Notable for the following:    WBC 18.8 (*)     RBC 2.12 (*)     Hemoglobin 6.5 (*)     HCT 18.5 (*)     All other components within normal limits  COMPREHENSIVE METABOLIC PANEL - Abnormal; Notable for the following:    CO2 14 (*)     Glucose, Bld 184 (*)     BUN 72 (*)     Creatinine, Ser 1.50 (*)     Calcium 7.5 (*)     Total Protein 5.0 (*)     Albumin 2.7 (*)     Total Bilirubin 0.1 (*)     GFR calc non Af Amer 38 (*)     GFR calc Af Amer 44 (*)     All other components within normal limits  PROTIME-INR - Abnormal; Notable for the following:    Prothrombin Time 15.6 (*)     All other components within normal limits  POCT I-STAT, CHEM 8 - Abnormal; Notable for the following:    Chloride 113 (*)     BUN 83 (*)     Creatinine, Ser 1.40 (*)     Glucose, Bld 174 (*)     Calcium, Ion 1.09 (*)     Hemoglobin 5.8 (*)     HCT 17.0 (*)     All other components within normal limits  TYPE AND SCREEN  PREPARE RBC (CROSSMATCH)  ABO/RH  ANTIBODY SCREEN  COMPREHENSIVE METABOLIC PANEL  CBC  APTT  URINALYSIS, ROUTINE W REFLEX MICROSCOPIC  CBC  BASIC METABOLIC PANEL  GLUCOSE, RANDOM   No results found.  EKG:  Rhythm: Normal sinus rhythm Rate: 86 Axis: Normal Intervals: normal ST segments: Normal   1. Hematemesis   2. Hypotension   3. Abdominal pain       MDM  56y female with abdominal pain, hematemesis and dizziness. Hypotensive to 60s/40s. No history of significant GI bleeding. Patient with EGD in 2009 by Dr. Elenore Paddy which was unremarkable aside from a hiatal hernia. At this time, I presume  patient's symptoms and hypotension are secondary to an upper GI bleed. Patient with no known history of liver dysfunction or esophageal varices. 2 IVs were established. IV fluids were bolusing. Protonix. GI consulted to discuss case and possible emergent endoscopy.  6:27 PM BP  normalizing with IVF. Pt now on 3rd liter. Will defer from emergent transfusion and wait for labs to be resulted.  6:36 PM Discussed case with Dr Arlyce Dice, GI. Requesting that hospitalist see pt and call him.  7:09 PM Discussed with Dr Evlyn Kanner, on call for Dr Timothy Lasso.     Raeford Razor, MD 08/31/12 2036

## 2012-08-31 NOTE — ED Notes (Signed)
Dr Arlyce Dice informed of a critical value. Hemoglobin 6.5.

## 2012-08-31 NOTE — ED Notes (Signed)
First bag of blood given and stopped.  Second bag of blood started.

## 2012-08-31 NOTE — H&P (Signed)
HPI: This is a  *56 year old Afro-American female admitted with hematemesis. Earlier today she passed a solid black stool. She felt somewhat weak. Several hours ago she had a syncopal episode and awakened*and had a large amount of hematemesis.  She was brought to the ER. She has no history of peptic ulcer disease or liver disease. She is on no gastric irritants. Since June she has been on an experimental medication for psoriasis. By name it appears to be an antitumor necrosis factor medication. For the past 2 days she's been taking 800 mg of ibuprofen for sinus infection. Denies  pyrosis or dysphagia.  she has minimal discomfort in the lower abdomen. She does not drink. She takes Protonix intermittently for heartburn.*Upper endoscopy in 2009 for an iron deficiency anemia demonstrated a hiatal hernia.   Colonoscopy in 2007 demonstrated hemorrhoids.    Review of systems: Pertinent positive and negative review of systems were noted in the above HPI section. Complete review of systems was performed and was otherwise normal.    Past Medical History  Diagnosis Date  . Abnormal Pap smear 1983  . GERD (gastroesophageal reflux disease)   . Menorrhagia     Hx  . Insomnia   . Psoriasis    non-insulin-dependent diabetes Hypertension  Past Surgical History  Procedure Date  . Tubal ligation     Current Facility-Administered Medications  Medication Dose Route Frequency Provider Last Rate Last Dose  . [COMPLETED] fentaNYL (SUBLIMAZE) injection 50 mcg  50 mcg Intravenous Once Raeford Razor, MD   50 mcg at 08/31/12 1824  . [COMPLETED] ondansetron (ZOFRAN) injection 4 mg  4 mg Intravenous Once Raeford Razor, MD   4 mg at 08/31/12 1824  . [COMPLETED] pantoprazole (PROTONIX) 80 mg in sodium chloride 0.9 % 100 mL IVPB  80 mg Intravenous Once Raeford Razor, MD 300 mL/hr at 08/31/12 1837 80 mg at 08/31/12 1837  . pantoprazole (PROTONIX) 80 mg in sodium chloride 0.9 % 250 mL infusion  8 mg/hr Intravenous  Continuous Raeford Razor, MD 25 mL/hr at 08/31/12 1838 8 mg/hr at 08/31/12 1838  . [COMPLETED] sodium chloride 0.9 % bolus 2,000 mL  2,000 mL Intravenous Once Raeford Razor, MD   2,000 mL at 08/31/12 1805   Current Outpatient Prescriptions  Medication Sig Dispense Refill  . fosinopril (MONOPRIL) 40 MG tablet Take 40 mg by mouth daily.      . hydrochlorothiazide (HYDRODIURIL) 25 MG tablet Take 25 mg by mouth daily.      . metFORMIN (GLUCOPHAGE) 500 MG tablet Take 500 mg by mouth 2 (two) times daily with a meal.      . pantoprazole (PROTONIX) 40 MG tablet Take 40 mg by mouth daily.      . simvastatin (ZOCOR) 20 MG tablet Take 20 mg by mouth every evening.        Allergies as of 08/31/2012 - Review Complete 08/31/2012  Allergen Reaction Noted  . Ultram (tramadol)  03/26/2012  . Sulfonamide derivatives Nausea And Vomiting 05/01/2008    No family history on file.  History   Social History  . Marital Status: Married    Spouse Name: N/A    Number of Children: N/A  . Years of Education: N/A   Occupational History  . Not on file.   Social History Main Topics  . Smoking status: Current Every Day Smoker -- 1.0 packs/day    Types: Cigarettes  . Smokeless tobacco: Never Used  . Alcohol Use: No  . Drug Use: No  . Sexually  Active: Yes    Birth Control/ Protection: Surgical     Comment: BTL   Other Topics Concern  . Not on file   Social History Narrative  . No narrative on file       Physical Exam: BP 91/71  Pulse 85  Temp 97.9 F (36.6 C) (Oral)  Resp 24  SpO2 99% Constitutional: generally well-appearing Psychiatric: alert and oriented x3 Eyes: extraocular movements intact Mouth: oral pharynx moist, no lesions Neck: supple no lymphadenopathy Cardiovascular: heart regular rate and rhythm Lungs: clear to auscultation bilaterally Abdomen: soft, nontender, nondistended, no obvious ascites, no peritoneal signs, normal bowel sounds Extremities: no lower extremity edema  bilaterally Skin: no lesions on visible extremities  Lab is pertinent for white count 18.8, hemoglobin 6.5 and hematocrit 18.5. BUN 83 creatinine 1.4   Assessment and plan: 56 y.o. female with  *hematemesis secondary to acute upper GI bleed. Active peptic ulcer disease is a likely possibility. Bleeding from Mallory-Weiss tear must also be considered. Patient has taken only a very short course of NSAIDs.  I think it is unlikely that she led acute lead to a hemoglobin of 6. She likely has a chronic anemia with a superimposed GI bleed.  Recommendations #1 transfuse to hemoglobin 8 to 9 #2 PPI drip #3 endoscopy in a.m.**  Other medical problems  #1 diabetes #2 hypertension #3 psoriasis

## 2012-09-01 ENCOUNTER — Encounter (HOSPITAL_COMMUNITY)
Admission: EM | Disposition: A | Discharge status: Home / Self Care | Payer: Self-pay | Source: Home / Self Care | Attending: Gastroenterology

## 2012-09-01 ENCOUNTER — Encounter (HOSPITAL_COMMUNITY): Payer: Self-pay | Admitting: *Deleted

## 2012-09-01 DIAGNOSIS — K254 Chronic or unspecified gastric ulcer with hemorrhage: Secondary | ICD-10-CM

## 2012-09-01 HISTORY — PX: ESOPHAGOGASTRODUODENOSCOPY: SHX5428

## 2012-09-01 HISTORY — DX: Chronic or unspecified gastric ulcer with hemorrhage: K25.4

## 2012-09-01 LAB — TYPE AND SCREEN
ABO/RH(D): O POS
Antibody Screen: NEGATIVE
Unit division: 0
Unit division: 0

## 2012-09-01 LAB — COMPREHENSIVE METABOLIC PANEL
ALT: 13 U/L (ref 0–35)
AST: 27 U/L (ref 0–37)
Albumin: 2.8 g/dL — ABNORMAL LOW (ref 3.5–5.2)
Alkaline Phosphatase: 43 U/L (ref 39–117)
Chloride: 113 mEq/L — ABNORMAL HIGH (ref 96–112)
Potassium: 5.1 mEq/L (ref 3.5–5.1)
Sodium: 140 mEq/L (ref 135–145)
Total Bilirubin: 0.2 mg/dL — ABNORMAL LOW (ref 0.3–1.2)
Total Protein: 5.1 g/dL — ABNORMAL LOW (ref 6.0–8.3)

## 2012-09-01 LAB — CBC
MCV: 86.1 fL (ref 78.0–100.0)
Platelets: 181 10*3/uL (ref 150–400)
RBC: 2.74 MIL/uL — ABNORMAL LOW (ref 3.87–5.11)
RDW: 13.5 % (ref 11.5–15.5)
WBC: 11.9 10*3/uL — ABNORMAL HIGH (ref 4.0–10.5)

## 2012-09-01 LAB — URINE MICROSCOPIC-ADD ON

## 2012-09-01 LAB — URINALYSIS, ROUTINE W REFLEX MICROSCOPIC
Bilirubin Urine: NEGATIVE
Glucose, UA: NEGATIVE mg/dL
Hgb urine dipstick: NEGATIVE
Ketones, ur: NEGATIVE mg/dL
Protein, ur: NEGATIVE mg/dL

## 2012-09-01 LAB — APTT: aPTT: 32 seconds (ref 24–37)

## 2012-09-01 SURGERY — EGD (ESOPHAGOGASTRODUODENOSCOPY)
Anesthesia: Moderate Sedation

## 2012-09-01 MED ORDER — GLYCOPYRROLATE 0.2 MG/ML IJ SOLN
INTRAMUSCULAR | Status: AC
  Filled 2012-09-01: qty 1

## 2012-09-01 MED ORDER — ACETAMINOPHEN 325 MG PO TABS
650.0000 mg | ORAL_TABLET | ORAL | Status: DC | PRN
  Administered 2012-09-01 – 2012-09-02 (×3): 650 mg via ORAL
  Filled 2012-09-01 (×3): qty 2

## 2012-09-01 MED ORDER — BUTAMBEN-TETRACAINE-BENZOCAINE 2-2-14 % EX AERO
INHALATION_SPRAY | CUTANEOUS | Status: DC | PRN
  Administered 2012-09-01: 2 via TOPICAL

## 2012-09-01 MED ORDER — FOSINOPRIL SODIUM 20 MG PO TABS: 40.0000 mg | ORAL_TABLET | Freq: Every day | ORAL | Status: DC

## 2012-09-01 MED ORDER — SODIUM CHLORIDE 0.9 % IV SOLN: INTRAVENOUS | Status: DC

## 2012-09-01 MED ORDER — MIDAZOLAM HCL 10 MG/2ML IJ SOLN
INTRAMUSCULAR | Status: AC
  Filled 2012-09-01: qty 4

## 2012-09-01 MED ORDER — MIDAZOLAM HCL 10 MG/2ML IJ SOLN
INTRAMUSCULAR | Status: DC | PRN
  Administered 2012-09-01: 2 mg via INTRAVENOUS
  Administered 2012-09-01: 2.5 mg via INTRAVENOUS
  Administered 2012-09-01: 2 mg via INTRAVENOUS
  Administered 2012-09-01: 2.5 mg via INTRAVENOUS

## 2012-09-01 MED ORDER — ACETAMINOPHEN 325 MG PO TABS
ORAL_TABLET | ORAL | Status: AC
  Administered 2012-09-01: 650 mg
  Filled 2012-09-01: qty 2

## 2012-09-01 MED ORDER — LISINOPRIL 40 MG PO TABS
40.0000 mg | ORAL_TABLET | Freq: Every day | ORAL | Status: DC
  Administered 2012-09-02: 40 mg via ORAL
  Filled 2012-09-01 (×3): qty 1

## 2012-09-01 MED ORDER — METFORMIN HCL 500 MG PO TABS
500.0000 mg | ORAL_TABLET | Freq: Two times a day (BID) | ORAL | Status: DC
  Administered 2012-09-01 – 2012-09-02 (×2): 500 mg via ORAL
  Filled 2012-09-01 (×4): qty 1

## 2012-09-01 MED ORDER — FENTANYL CITRATE 0.05 MG/ML IJ SOLN
INTRAMUSCULAR | Status: DC | PRN
  Administered 2012-09-01 (×3): 25 ug via INTRAVENOUS

## 2012-09-01 MED ORDER — GLYCOPYRROLATE 0.2 MG/ML IJ SOLN
INTRAMUSCULAR | Status: DC | PRN
  Administered 2012-09-01: 0.2 mg via INTRAVENOUS

## 2012-09-01 MED ORDER — SIMVASTATIN 20 MG PO TABS
20.0000 mg | ORAL_TABLET | Freq: Every day | ORAL | Status: DC
  Administered 2012-09-01: 20 mg via ORAL
  Filled 2012-09-01 (×2): qty 1

## 2012-09-01 MED ORDER — DIPHENHYDRAMINE HCL 50 MG/ML IJ SOLN
INTRAMUSCULAR | Status: AC
  Filled 2012-09-01: qty 1

## 2012-09-01 MED ORDER — FENTANYL CITRATE 0.05 MG/ML IJ SOLN
INTRAMUSCULAR | Status: AC
  Filled 2012-09-01: qty 4

## 2012-09-01 MED ORDER — HYDROCHLOROTHIAZIDE 25 MG PO TABS: 25.0000 mg | ORAL_TABLET | Freq: Every day | ORAL | Status: DC

## 2012-09-01 NOTE — Progress Notes (Signed)
[facility] Progress Note  Subjective: *No further nausea or vomiting.**  Objective:  Vital signs in last 24 hours: Temp:  [97.4 F (36.3 C)-99.1 F (37.3 C)] 98.4 F (36.9 C) (11/09 1242) Pulse Rate:  [73-120] 120  (11/09 1235) Resp:  [12-35] 14  (11/09 1242) BP: (85-133)/(31-93) 93/46 mmHg (11/09 1242) SpO2:  [94 %-100 %] 100 % (11/09 1242) Weight:  [173 lb 11.6 oz (78.8 kg)] 173 lb 11.6 oz (78.8 kg) (11/09 0300) See 2 units packed cells Last BM Date: 08/31/12 General:   Alert,  Well-developed,  white female in NAD Heart:  Regular rate and rhythm; no murmurs Abdomen:  Soft, nontender and nondistended. Normal bowel sounds, without guarding, and without rebound.   Extremities:  Without edema. Neurologic:  Alert and  oriented x4;  grossly normal neurologically. Psych:  Alert and cooperative. Normal mood and affect.  Intake/Output from previous day: 11/08 0701 - 11/09 0700 In: 2925 [I.V.:1550; Blood:1375] Out: 2650 [Urine:2650] Intake/Output this shift: Total I/O In: 0  Out: 550 [Urine:550]  Lab Results:  [facility] 09/01/12 0521 08/31/12 1915 08/31/12 1855  WBC 11.9* -- 18.8*  HGB 8.4* 5.8* 6.5*  HCT 23.6* 17.0* 18.5*  PLT 181 -- 223   BMET  Basename 09/01/12 0521 08/31/12 1915 08/31/12 1855  NA 140 141 137  K 5.1 4.5 4.4  CL 113* 113* 109  CO2 20 -- 14*  GLUCOSE 188* 174* 184*  BUN 58* 83* 72*  CREATININE 1.30* 1.40* 1.50*  CALCIUM 7.8* -- 7.5*   LFT  Basename 09/01/12 0521  PROT 5.1*  ALBUMIN 2.8*  AST 27  ALT 13  ALKPHOS 43  BILITOT 0.2*  BILIDIR --  IBILI --   PT/INR  Basename 08/31/12 1855  LABPROT 15.6*  INR 1.27   Hepatitis Panel No results found for this basename: HEPBSAG,HCVAB,HEPAIGM,HEPBIGM in the last 72 hours  Studies/Results: No results found.Upper endoscopy demonstrated an active gastric ulcer   Assessment / Plan: *#1 bleeding gastric ulcer. There is no further bleeding. The atypical area for the ulcer raises the  question whether this is a benign or malignant ulcer. In view of the other erosions I favor a benign process.  Plan to advance diet and continue PPI drip for another 24 hours  ** Active Problems:  Blood in stool  Acute posthemorrhagic anemia  Gastric ulcer with hemorrhage     LOS: 1 day   Melvia Heaps  09/01/2012, 12:51 PM

## 2012-09-01 NOTE — Interval H&P Note (Signed)
History and Physical Interval Note:  09/01/2012 12:19 PM  Kristi Avery  has presented today for surgery, with the diagnosis of GIB  The various methods of treatment have been discussed with the patient and family. After consideration of risks, benefits and other options for treatment, the patient has consented to  Procedure(s) (LRB) with comments: ESOPHAGOGASTRODUODENOSCOPY (EGD) (N/A) as a surgical intervention .  The patient's history has been reviewed, patient examined, no change in status, stable for surgery.  I have reviewed the patient's chart and labs.  Questions were answered to the patient's satisfaction.    The recent H&P (dated *08/31/12 **) was reviewed, the patient was examined and there is no change in the patients condition since that H&P was completed.   Kristi Avery  09/01/2012, 12:19 PM    Kristi Avery

## 2012-09-02 DIAGNOSIS — K920 Hematemesis: Secondary | ICD-10-CM

## 2012-09-02 LAB — CBC
HCT: 22 % — ABNORMAL LOW (ref 36.0–46.0)
Platelets: 181 10*3/uL (ref 150–400)
RBC: 2.55 MIL/uL — ABNORMAL LOW (ref 3.87–5.11)
RDW: 13.6 % (ref 11.5–15.5)
WBC: 12.2 10*3/uL — ABNORMAL HIGH (ref 4.0–10.5)

## 2012-09-02 LAB — GLUCOSE, CAPILLARY

## 2012-09-02 LAB — BASIC METABOLIC PANEL
Calcium: 8.3 mg/dL — ABNORMAL LOW (ref 8.4–10.5)
Chloride: 110 mEq/L (ref 96–112)
Creatinine, Ser: 1.02 mg/dL (ref 0.50–1.10)
GFR calc Af Amer: 70 mL/min — ABNORMAL LOW (ref >90)
Sodium: 136 mEq/L (ref 135–145)

## 2012-09-02 MED ORDER — FERROUS SULFATE 325 (65 FE) MG PO TABS
325.0000 mg | ORAL_TABLET | Freq: Two times a day (BID) | ORAL | Status: DC
  Filled 2012-09-02 (×2): qty 1

## 2012-09-02 MED ORDER — FERROUS SULFATE 325 (65 FE) MG PO TABS: 325.0000 mg | ORAL_TABLET | Freq: Two times a day (BID) | ORAL | Status: DC

## 2012-09-02 MED ORDER — PANTOPRAZOLE SODIUM 40 MG PO TBEC: 40.0000 mg | DELAYED_RELEASE_TABLET | Freq: Two times a day (BID) | ORAL | Status: DC

## 2012-09-02 NOTE — Progress Notes (Signed)
Patient discharged home with family, discharge instructions given and explained to patient and she verbalized understanding, denies any pain/distress. Skin intact, no wound. Accompanied home by family.

## 2012-09-02 NOTE — Progress Notes (Signed)
Nutrition Brief Note  Patient identified on the Malnutrition Screening Tool (MST) Report  Body mass index is 32.82 kg/(m^2). Pt meets criteria for obesity grade 1 based on current BMI.   Current diet order is CHO MOD MED, patient is consuming approximately 75% of meals at this time. Labs and medications reviewed.    No nutrition interventions warranted at this time. If nutrition issues arise, please consult RD.   Oran Rein, RD, LDN Clinical Inpatient Dietitian Pager:  [phone] Weekend and after hours pager:  [phone]

## 2012-09-02 NOTE — Discharge Summary (Signed)
[facility] Discharge Summary  Name: Kristi Avery MRN: [id number] DOB: [birth year] 56 y.o. PCP:  Gwen Pounds, MD  Date of Admission: 08/31/2012  5:00 PM Date of Discharge: 09/02/2012 Attending Physician: Louis Meckel, MD Primary Gastroenterologist: Stan Head, MD Discharging Physician: Melvia Heaps, MD  Discharge Diagnosis: 1. Upper GI bleed secondary gastric ulcer, resolved.  2. Acute on chronic anemia. Hemoglobin down to 5.8 from 12.0 in May 2012.   Consultations: none  GI Procedures:  EGD with biopsies.   History/Physical Exam:  See Admission H&P  Admission HPI: Done by Dr. Arlyce Dice  This is a 56 year old Afro-American female admitted with hematemesis. Earlier today she passed a solid black stool. She felt somewhat weak. Several hours ago she had a syncopal episode and awakened*and had a large amount of hematemesis. She was brought to the ER. She has no history of peptic ulcer disease or liver disease. She is on no gastric irritants. Since June she has been on an experimental medication for psoriasis. By name it appears to be an antitumor necrosis factor medication. For the past 2 days she's been taking 800 mg of ibuprofen for sinus infection. Denies pyrosis or dysphagia. she has minimal discomfort in the lower abdomen. She does not drink. She takes Protonix intermittently for heartburn.*Upper endoscopy in 2009 for an iron deficiency anemia demonstrated a hiatal hernia. Colonoscopy in 2007 demonstrated hemorrhoids   Hospital Course by problem list: 1.  Upper GI bleed secondary to gastric ulcer. Patient presented to ED with hematemesis, melena, and profound anemia. She was admitted for IVF, IV PPI and blood transfusion. Patient underwent EGD the following morning with findings of a 1.5 - 2 cm gastric cardia ulcer which was not actively bleeding at time of EGD. The following morning patient felt okay, no further bleeding. Her hemoglobin remained stable at 7.9. She was  converted to PO PPI and discharged home in stable condition.  Gastric biopsies pending at time of discharge. Patient was advised to avoid NSAIDS. She would call our office for a hospital follow up.   2.  Anemia of acute blood loss, hemoglobin rose from 5.8 to 8.4 post blood transfusion. It appears patient only got ONE unit of blood. She was discharged home on oral iron.   3. Diabetes, on oral agents at home. Blood glucose remained <200 throughout this admission.   Discharge Vitals:  BP 120/48  Pulse 91  Temp 98 F (36.7 C) (Oral)  Resp 20  Ht 5\' 1"  (1.549 m)  Wt 173 lb 11.6 oz (78.8 kg)  BMI 32.82 kg/m2  SpO2 99%  Discharge Labs:  Results for orders placed during the hospital encounter of 08/31/12 (from the past 24 hour(s))  BASIC METABOLIC PANEL     Status: Abnormal   Collection Time   09/02/12  5:16 AM      Component Value Range   Sodium 136  135 - 145 mEq/L   Potassium 5.6 (*) 3.5 - 5.1 mEq/L   Chloride 110  96 - 112 mEq/L   CO2 19  19 - 32 mEq/L   Glucose, Bld 171 (*) 70 - 99 mg/dL   BUN 22  6 - 23 mg/dL   Creatinine, Ser 4.09  0.50 - 1.10 mg/dL   Calcium 8.3 (*) 8.4 - 10.5 mg/dL   GFR calc non Af Amer 60 (*) >90 mL/min   GFR calc Af Amer 70 (*) >90 mL/min  CBC     Status: Abnormal   Collection Time   09/02/12  6:36 AM      Component Value Range   WBC 12.2 (*) 4.0 - 10.5 K/uL   RBC 2.55 (*) 3.87 - 5.11 MIL/uL   Hemoglobin 7.9 (*) 12.0 - 15.0 g/dL   HCT 16.1 (*) 09.6 - 04.5 %   MCV 86.3  78.0 - 100.0 fL   MCH 31.0  26.0 - 34.0 pg   MCHC 35.9  30.0 - 36.0 g/dL   RDW 40.9  81.1 - 91.4 %   Platelets 181  150 - 400 K/uL  GLUCOSE, CAPILLARY     Status: Abnormal   Collection Time   09/02/12  8:11 AM      Component Value Range   Glucose-Capillary 158 (*) 70 - 99 mg/dL   Comment 1 Notify RN      Disposition and follow-up:   [name] was discharged from [facility] in stable condition.    Follow-up Appointments: Discharge Orders    Future Orders  Please Complete By Expires   Resume previous diet      Discharge instructions      Comments:   Avoid Ibuprofen, Advil, Motrin, or other gastric irritants until seen in our office.      Discharge Medications:   Medication List     As of 09/02/2012  1:28 PM    TAKE these medications         ferrous sulfate 325 (65 FE) MG tablet   Take 1 tablet (325 mg total) by mouth 2 (two) times daily with a meal.      fosinopril 40 MG tablet   Commonly known as: MONOPRIL   Take 40 mg by mouth daily.      hydrochlorothiazide 25 MG tablet   Commonly known as: HYDRODIURIL   Take 25 mg by mouth daily.      metFORMIN 500 MG tablet   Commonly known as: GLUCOPHAGE   Take 500 mg by mouth 2 (two) times daily with a meal.      pantoprazole 40 MG tablet   Commonly known as: PROTONIX   Take 40 mg by mouth daily.      simvastatin 20 MG tablet   Commonly known as: ZOCOR   Take 20 mg by mouth every evening.        Signed: Willette Cluster 09/02/2012, 1:28 PM

## 2012-09-02 NOTE — Progress Notes (Signed)
[facility] Progress Note  SUBJECTIVE: no abdominal pain, no GI bleeding (No BMs)  OBJECTIVE:  Vital signs in last 24 hours: Temp:  [98 F (36.7 C)-99 F (37.2 C)] 98 F (36.7 C) (11/10 0628) Pulse Rate:  [75-120] 91  (11/10 0628) Resp:  [14-35] 20  (11/10 0628) BP: (85-133)/(44-89) 120/48 mmHg (11/10 0938) SpO2:  [97 %-100 %] 99 % (11/10 0628) Last BM Date: 08/31/12 General:    black female in NAD Heart:  Regular rate and rhythm Abdomen:  Soft, nontender and nondistended. Normal bowel sounds. Neurologic:  Alert and oriented,  grossly normal neurologically. Psych:  Cooperative. Normal mood and affect.     Lab Results:  Basename 09/02/12 0636 09/01/12 0521 08/31/12 1915 08/31/12 1855  WBC 12.2* 11.9* -- 18.8*  HGB 7.9* 8.4* 5.8* --  HCT 22.0* 23.6* 17.0* --  PLT 181 181 -- 223   BMET  Basename 09/02/12 0516 09/01/12 0521 08/31/12 1915 08/31/12 1855  NA 136 140 141 --  K 5.6* 5.1 4.5 --  CL 110 113* 113* --  CO2 19 20 -- 14*  GLUCOSE 171* 188* 174* --  BUN 22 58* 83* --  CREATININE 1.02 1.30* 1.40* --  CALCIUM 8.3* 7.8* -- 7.5*   LFT  Basename 09/01/12 0521  PROT 5.1*  ALBUMIN 2.8*  AST 27  ALT 13  ALKPHOS 43  BILITOT 0.2*  BILIDIR --  IBILI --   PT/INR  Basename 08/31/12 1855  LABPROT 15.6*  INR 1.27      ASSESSMENT / PLAN:  1. UGIB. EGD yesterday revealed gastric ulcer (cardia). Biopsies still pending. Can d/c PPI drip and change to PO BID. Decrease IVF to 75cc/hr.  Hopefully home soon. Patient will need follow up in our office in 4-6 weeks. Avoid NSAIDS and smoking.  2.  Anemia of acute blood loss. Hgb 12.0 May 2012, admitted with hgb of 6.5 then down to 5.8. Patient transfused 2 units of PRBC on 09/01/12.  Hemoglobin 7.9 today, stable. Will start oral iron.    3. Tobacco abuse, advised to discontinue smoking.   4. Telemetry. NSR, no abnormal activity . Will discontinue.    LOS: 2 days   Willette Cluster  09/02/2012, 11:48  AM   I have personally taken an interval history, reviewed the chart, and examined the patient. Patient is stable and OK for d/c.  She will need f/u EGD in approximately 6 weeks.   Barbette Hair. Arlyce Dice, MD, [facility] [phone]

## 2012-09-03 ENCOUNTER — Encounter (HOSPITAL_COMMUNITY): Payer: Self-pay | Admitting: Gastroenterology

## 2012-09-03 NOTE — Op Note (Signed)
[facility] [address] [place], [address]   ENDOSCOPY PROCEDURE REPORT  PATIENT: Kristi Avery, Kristi Avery  MR#: [id number] BIRTHDATE: 1956-05-08 , 56  yrs. old GENDER: Female ENDOSCOPIST: Louis Meckel, MD REFERRED BY:  [name], M.D. PROCEDURE DATE:  09/01/2012 PROCEDURE:  EGD w/ biopsy ASA CLASS:     Class II INDICATIONS:  hematemesis. MEDICATIONS: These medications were titrated to patient response per physician's verbal order, Versed 9 mg IV, Fentanyl 100 mcg IV, and Robinul 0.2 mg IV TOPICAL ANESTHETIC: Cetacaine Spray  DESCRIPTION OF PROCEDURE: After the risks benefits and alternatives of the procedure were thoroughly explained, informed consent was obtained.  The [id number] ([id number]) endoscope was introduced through the mouth and advanced to the third portion of the duodenum. Without limitations.  The instrument was slowly withdrawn as the mucosa was fully examined.      In the gastric cardia there was a 1.5-2 cm ulcer within an enlarged fold.  There were red spots and old pigment at the base of the ulcer.  Biopsies were taken from all 4 quadrants surrounding the ulcer.  There were several superficial erosions in the gastric cardia as well. In the pyloric channel there were areas of erythema.   The remainder of the upper endoscopy exam was otherwise normal.  Retroflexed views revealed an ulcer.     The scope was then withdrawn from the patient and the procedure completed.  COMPLICATIONS: There were no complications. ENDOSCOPIC IMPRESSION: 1.   acute gastric ulcer 2.  gastric erosions  RECOMMENDATIONS: 1.  continue PPI drip 2.  await biopsies 3.  avoid NSAIDs REPEAT EXAM:  eSigned:  Louis Meckel, MD 09/01/2012 12:42 PM   CC:

## 2012-09-11 ENCOUNTER — Telehealth: Payer: Self-pay

## 2012-09-11 NOTE — Telephone Encounter (Signed)
Left message for pt to call back  °

## 2012-09-11 NOTE — Progress Notes (Signed)
Quick Note:  Patient has H. pylori. Please prescribe Prevpac. She also needs a followup office visit. ______

## 2012-09-11 NOTE — Telephone Encounter (Signed)
Message copied by Chrystie Nose on Tue Sep 11, 2012  2:42 PM ------      Message from: Melvia Heaps D      Created: Tue Sep 11, 2012  2:30 PM       Patient has H. pylori. Please prescribe Prevpac. She also needs a followup office visit.

## 2012-09-12 ENCOUNTER — Telehealth: Payer: Self-pay

## 2012-09-12 MED ORDER — AMOXICILL-CLARITHRO-LANSOPRAZ PO MISC: Freq: Two times a day (BID) | ORAL | Status: DC

## 2012-09-12 NOTE — Telephone Encounter (Signed)
Message copied by Chrystie Nose on Wed Sep 12, 2012  2:26 PM ------      Message from: Melvia Heaps D      Created: Tue Sep 11, 2012  2:30 PM       Patient has H. pylori. Please prescribe Prevpac. She also needs a followup office visit.

## 2012-09-12 NOTE — Telephone Encounter (Signed)
Pt aware and rx sent to the pharmacy. OV scheduled and letter mailed to pt.

## 2012-09-25 ENCOUNTER — Ambulatory Visit: Payer: BC Managed Care – PPO | Admitting: Gastroenterology

## 2012-09-26 ENCOUNTER — Ambulatory Visit (INDEPENDENT_AMBULATORY_CARE_PROVIDER_SITE_OTHER): Payer: BC Managed Care – PPO | Admitting: Gastroenterology

## 2012-09-26 ENCOUNTER — Encounter: Payer: Self-pay | Admitting: Gastroenterology

## 2012-09-26 VITALS — BP 110/60 | HR 80 | Ht 61.0 in | Wt 165.5 lb

## 2012-09-26 DIAGNOSIS — K254 Chronic or unspecified gastric ulcer with hemorrhage: Secondary | ICD-10-CM

## 2012-09-26 NOTE — Patient Instructions (Addendum)
You have been given a separate informational sheet regarding your tobacco use, the importance of quitting and local resources to help you quit. It has been recommended to you by Dr Arlyce Dice that you have an Upper Endoscopy Call back at [phone] when you are ready to schedule that appointment You will need to come in about a week before your procedure to sign your paperwork

## 2012-09-26 NOTE — Progress Notes (Signed)
History of Present Illness:  Kristi Avery has returned following hospitalization for an acute upper GI bleed secondary to a gastric ulcer. She was taking NSAIDs prior to admission. Biopsies demonstrated H. pylori for which she was treated with a Prevpac. She has no GI complaints. She remains on protonix and supplementary iron.    Review of Systems: Pertinent positive and negative review of systems were noted in the above HPI section. All other review of systems were otherwise negative.    Current Medications, Allergies, Past Medical History, Past Surgical History, Family History and Social History were reviewed in [employer] electronic medical record  Vital signs were reviewed in today's medical record. Physical Exam: General: Well developed , well nourished, no acute distress Head: Normocephalic and atraumatic Eyes:  sclerae anicteric, EOMI Ears: Normal auditory acuity Mouth: No deformity or lesions Lungs: Clear throughout to auscultation Heart: Regular rate and rhythm; no murmurs, rubs or bruits Abdomen: Soft, non tender and non distended. No masses, hepatosplenomegaly or hernias noted. Normal Bowel sounds Rectal:deferred Musculoskeletal: Symmetrical with no gross deformities  Pulses:  Normal pulses noted Extremities: No clubbing, cyanosis, edema or deformities noted Neurological: Alert oriented x 4, grossly nonfocal Psychological:  Alert and cooperative. Normal mood and affect

## 2012-09-26 NOTE — Assessment & Plan Note (Signed)
Plan followup endoscopy to document ulcer healing.

## 2012-11-15 ENCOUNTER — Encounter: Payer: Self-pay | Admitting: Gastroenterology

## 2013-01-04 ENCOUNTER — Ambulatory Visit (AMBULATORY_SURGERY_CENTER): Payer: BC Managed Care – PPO | Admitting: *Deleted

## 2013-01-04 VITALS — Ht 61.0 in | Wt 168.0 lb

## 2013-01-07 ENCOUNTER — Encounter: Payer: Self-pay | Admitting: Gastroenterology

## 2013-01-18 ENCOUNTER — Ambulatory Visit (AMBULATORY_SURGERY_CENTER): Payer: BC Managed Care – PPO | Admitting: Gastroenterology

## 2013-01-18 ENCOUNTER — Other Ambulatory Visit: Payer: Self-pay | Admitting: Gastroenterology

## 2013-01-18 ENCOUNTER — Encounter: Payer: Self-pay | Admitting: Gastroenterology

## 2013-01-18 VITALS — BP 119/60 | HR 58 | Temp 98.4°F | Resp 32 | Ht 61.0 in | Wt 168.0 lb

## 2013-01-18 DIAGNOSIS — Z8719 Personal history of other diseases of the digestive system: Secondary | ICD-10-CM

## 2013-01-18 LAB — GLUCOSE, CAPILLARY: Glucose-Capillary: 136 mg/dL — ABNORMAL HIGH (ref 70–99)

## 2013-01-18 MED ORDER — SODIUM CHLORIDE 0.9 % IV SOLN: 500.0000 mL | INTRAVENOUS | Status: DC

## 2013-01-18 NOTE — Patient Instructions (Addendum)
Discharge instructions given with verbal understanding. Normal exam. Resume previous medications. YOU HAD AN ENDOSCOPIC PROCEDURE TODAY AT THE [facility]: Refer to the procedure report that was given to you for any specific questions about what was found during the examination.  If the procedure report does not answer your questions, please call your gastroenterologist to clarify.  If you requested that your care partner not be given the details of your procedure findings, then the procedure report has been included in a sealed envelope for you to review at your convenience later.  YOU SHOULD EXPECT: Some feelings of bloating in the abdomen. Passage of more gas than usual.  Walking can help get rid of the air that was put into your GI tract during the procedure and reduce the bloating. If you had a lower endoscopy (such as a colonoscopy or flexible sigmoidoscopy) you may notice spotting of blood in your stool or on the toilet paper. If you underwent a bowel prep for your procedure, then you may not have a normal bowel movement for a few days.  DIET: Your first meal following the procedure should be a light meal and then it is ok to progress to your normal diet.  A half-sandwich or bowl of soup is an example of a good first meal.  Heavy or fried foods are harder to digest and may make you feel nauseous or bloated.  Likewise meals heavy in dairy and vegetables can cause extra gas to form and this can also increase the bloating.  Drink plenty of fluids but you should avoid alcoholic beverages for 24 hours.  ACTIVITY: Your care partner should take you home directly after the procedure.  You should plan to take it easy, moving slowly for the rest of the day.  You can resume normal activity the day after the procedure however you should NOT DRIVE or use heavy machinery for 24 hours (because of the sedation medicines used during the test).    SYMPTOMS TO REPORT IMMEDIATELY: A gastroenterologist  can be reached at any hour.  During normal business hours, 8:30 AM to 5:00 PM Monday through Friday, call [phone].  After hours and on weekends, please call the GI answering service at [phone] who will take a message and have the physician on call contact you.   Following upper endoscopy (EGD)  Vomiting of blood or coffee ground material  New chest pain or pain under the shoulder blades  Painful or persistently difficult swallowing  New shortness of breath  Fever of 100F or higher  Black, tarry-looking stools  FOLLOW UP: If any biopsies were taken you will be contacted by phone or by letter within the next 1-3 weeks.  Call your gastroenterologist if you have not heard about the biopsies in 3 weeks.  Our staff will call the home number listed on your records the next business day following your procedure to check on you and address any questions or concerns that you may have at that time regarding the information given to you following your procedure. This is a courtesy call and so if there is no answer at the home number and we have not heard from you through the emergency physician on call, we will assume that you have returned to your regular daily activities without incident.  SIGNATURES/CONFIDENTIALITY: You and/or your care partner have signed paperwork which will be entered into your electronic medical record.  These signatures attest to the fact that that the information above on your After   Visit Summary has been reviewed and is understood.  Full responsibility of the confidentiality of this discharge information lies with you and/or your care-partner. 

## 2013-01-18 NOTE — Op Note (Signed)
[facility] [address] [place], [address]   ENDOSCOPY PROCEDURE REPORT  PATIENT: Kristi Avery, Kristi Avery  MR#: [id number] BIRTHDATE: 09/12/1956 , 56  yrs. old GENDER: Female ENDOSCOPIST: Louis Meckel, MD REFERRED BY:  [name], M.D. PROCEDURE DATE:  01/18/2013 PROCEDURE:  EGD, diagnostic ASA CLASS:     Class II INDICATIONS:  documentation of gastric ulcer healing MEDICATIONS: MAC sedation, administered by CRNA and propofol (Diprivan) 100mg  IV TOPICAL ANESTHETIC:  DESCRIPTION OF PROCEDURE: After the risks benefits and alternatives of the procedure were thoroughly explained, informed consent was obtained.  The LB [id number] endoscope was introduced through the mouth and advanced to the second portion of the duodenum. Without limitations.  The instrument was slowly withdrawn as the mucosa was fully examined.      The upper, middle and distal third of the esophagus were carefully inspected and no abnormalities were noted.  The z-line was well seen at the GEJ.  The endoscope was pushed into the fundus which was normal including a retroflexed view.  The antrum, gastric body, first and second part of the duodenum were unremarkable. Retroflexed views revealed no abnormalities.     The scope was then withdrawn from the patient and the procedure completed.  COMPLICATIONS: There were no complications. ENDOSCOPIC IMPRESSION: Normal EGD  RECOMMENDATIONS: Avoid NSAIDS  REPEAT EXAM:  eSigned:  Louis Meckel, MD 01/18/2013 10:27 AM   CC:

## 2013-01-18 NOTE — Progress Notes (Signed)
Patient did not experience any of the following events: a burn prior to discharge; a fall within the facility; wrong site/side/patient/procedure/implant event; or a hospital transfer or hospital admission upon discharge from the facility. (G8907) Patient did not have preoperative order for IV antibiotic SSI prophylaxis. (G8918)  

## 2013-01-21 ENCOUNTER — Telehealth: Payer: Self-pay

## 2013-01-21 NOTE — Telephone Encounter (Signed)
  Follow up Call-  Call back number 01/18/2013  Post procedure Call Back phone  # [phone]  Permission to leave phone message Yes     Patient questions:  Do you have a fever, pain , or abdominal swelling? no Pain Score  0 *  Have you tolerated food without any problems? yes  Have you been able to return to your normal activities? yes  Do you have any questions about your discharge instructions: Diet   no Medications  no Follow up visit  no  Do you have questions or concerns about your Care? no  Actions: * If pain score is 4 or above: No action needed, pain <4.

## 2013-02-27 ENCOUNTER — Ambulatory Visit (INDEPENDENT_AMBULATORY_CARE_PROVIDER_SITE_OTHER): Payer: BC Managed Care – PPO | Admitting: Emergency Medicine

## 2013-02-27 ENCOUNTER — Ambulatory Visit: Payer: BC Managed Care – PPO

## 2013-02-27 VITALS — BP 126/78 | HR 82 | Temp 98.8°F | Resp 18 | Ht 61.0 in | Wt 162.6 lb

## 2013-02-27 DIAGNOSIS — M25572 Pain in left ankle and joints of left foot: Secondary | ICD-10-CM

## 2013-02-27 DIAGNOSIS — M25579 Pain in unspecified ankle and joints of unspecified foot: Secondary | ICD-10-CM

## 2013-02-27 NOTE — Progress Notes (Signed)
  Subjective:    Patient ID: Kristi Avery, female    DOB: 1956/10/02, 57 y.o.   MRN: [id number]  HPI patient was in her usual state of health until last night when coming off the porch she stepped on across to and suffered a twisting injury to her left foot. Today she has had swelling in the medial portion of her foot as well as difficulty walking    Review of Systems     Objective:   Physical Exam there is tenderness and swelling medial portion of the left foot. There is pain with eversion. The ankle itself is normal. Neurovascular is intact .  [facility] reading (PRIMARY) by  Dr. Cleta Alberts no fractures seen.        Assessment & Plan:  Patient placed in a short cam boot no definite fracture seen on X.ray.

## 2013-04-08 ENCOUNTER — Other Ambulatory Visit: Payer: Self-pay | Admitting: Obstetrics and Gynecology

## 2013-04-08 DIAGNOSIS — Z1231 Encounter for screening mammogram for malignant neoplasm of breast: Secondary | ICD-10-CM

## 2013-04-18 ENCOUNTER — Ambulatory Visit (INDEPENDENT_AMBULATORY_CARE_PROVIDER_SITE_OTHER): Payer: BC Managed Care – PPO | Admitting: Family Medicine

## 2013-04-18 VITALS — BP 154/74 | HR 72 | Temp 98.2°F | Resp 20 | Ht 61.0 in | Wt 168.8 lb

## 2013-04-18 DIAGNOSIS — R059 Cough, unspecified: Secondary | ICD-10-CM

## 2013-04-18 DIAGNOSIS — H6590 Unspecified nonsuppurative otitis media, unspecified ear: Secondary | ICD-10-CM

## 2013-04-18 DIAGNOSIS — D649 Anemia, unspecified: Secondary | ICD-10-CM

## 2013-04-18 DIAGNOSIS — R05 Cough: Secondary | ICD-10-CM

## 2013-04-18 DIAGNOSIS — R42 Dizziness and giddiness: Secondary | ICD-10-CM

## 2013-04-18 DIAGNOSIS — J069 Acute upper respiratory infection, unspecified: Secondary | ICD-10-CM

## 2013-04-18 LAB — POCT CBC
Hemoglobin: 12.4 g/dL (ref 12.2–16.2)
Lymph, poc: 2.8 (ref 0.6–3.4)
MCHC: 32 g/dL (ref 31.8–35.4)
MID (cbc): 0.5 (ref 0–0.9)
MPV: 8.4 fL (ref 0–99.8)
POC Granulocyte: 2.3 (ref 2–6.9)
POC MID %: 8.5 %M (ref 0–12)
Platelet Count, POC: 361 10*3/uL (ref 142–424)
RDW, POC: 14.3 %

## 2013-04-18 MED ORDER — BENZONATATE 100 MG PO CAPS: 100.0000 mg | ORAL_CAPSULE | Freq: Three times a day (TID) | ORAL | Status: DC | PRN

## 2013-04-18 MED ORDER — AZITHROMYCIN 250 MG PO TABS: ORAL_TABLET | ORAL | Status: DC

## 2013-04-18 NOTE — Progress Notes (Signed)
Subjective:    Patient ID: Kristi Avery, female    DOB: 02-12-1956, 57 y.o.   MRN: [id number]  HPI Kristi Avery is a 57 y.o. female Hx of HTN, hyperlipidemia, DM2, other PMH per problem list - primary provider Dr. Timothy Lasso.   C/o cough, congestion - started 4 days ago with slight cough.  Tried mucinex next day - not really helping.  Noticed pressure in sinuses, ears, head today. Feels like fluid in ears, noted dizziness yesterday - has persisted today. Cough noted with lying down.  Not dyspneic. No known hx chf/cad.  Feels tight sensation in chest, but feels congestion symptoms, no radiation of pain, no n/v. No diaphoresis.  No known hx astham/copd/lung problems.   Hx of anemia - bleeding ulcer in November. Thinks blood count had improved in March.   Tx: mucinex, otc sinus pill at 11am - decongestant.     FH: mom with CHF in 60's, no known hx CAD.   EKG 08/31/12: SR, no acute findings.   No recent stress test.   Review of Systems  Constitutional: Negative for fever and chills.  HENT: Positive for ear pain (pressure. ). Negative for hearing loss, congestion, rhinorrhea and ear discharge.   Respiratory: Positive for cough (clear phlegm. ) and chest tightness. Negative for shortness of breath.   Neurological: Positive for dizziness and headaches. Negative for syncope, facial asymmetry, speech difficulty and weakness.       Objective:   Physical Exam  Vitals reviewed. Constitutional: She is oriented to person, place, and time. She appears well-developed and well-nourished. No distress.  HENT:  Head: Normocephalic and atraumatic.  Right Ear: Hearing, external ear and ear canal normal. A middle ear effusion (clear fluid behind both tm's. no erythenma, no discharge. ) is present.  Left Ear: Hearing, external ear and ear canal normal. A middle ear effusion is present.  Nose: Nose normal.  Mouth/Throat: Oropharynx is clear and moist. No oropharyngeal exudate.  Eyes: Conjunctivae and EOM are  normal. Pupils are equal, round, and reactive to light. Right eye exhibits no nystagmus. Left eye exhibits no nystagmus.  Neck: Trachea normal. Carotid bruit is not present.  Cardiovascular: Normal rate, regular rhythm, normal heart sounds, intact distal pulses and normal pulses.   No extrasystoles are present. PMI is not displaced.   No murmur heard. Pulmonary/Chest: Effort normal and breath sounds normal. No respiratory distress. She has no wheezes. She has no rhonchi.  Neurological: She is alert and oriented to person, place, and time. She has normal strength. No sensory deficit. She displays a negative Romberg sign. Coordination and gait normal.  Nonfocal. No pronator drift.   Skin: Skin is warm and dry. No rash noted.  Psychiatric: She has a normal mood and affect. Her behavior is normal.    EKG: NSR, no acute findings.  Results for orders placed in visit on 04/18/13  POCT CBC      Result Value Range   WBC 5.6  4.6 - 10.2 K/uL   Lymph, poc 2.8  0.6 - 3.4   POC LYMPH PERCENT 49.6  10 - 50 %L   MID (cbc) 0.5  0 - 0.9   POC MID % 8.5  0 - 12 %M   POC Granulocyte 2.3  2 - 6.9   Granulocyte percent 41.9  37 - 80 %G   RBC 4.20  4.04 - 5.48 M/uL   Hemoglobin 12.4  12.2 - 16.2 g/dL   HCT, POC 40.9  81.1 - 47.9 %  MCV 92.3  80 - 97 fL   MCH, POC 29.5  27 - 31.2 pg   MCHC 32.0  31.8 - 35.4 g/dL   RDW, POC 16.1     Platelet Count, POC 361  142 - 424 K/uL   MPV 8.4  0 - 99.8 fL      Assessment & Plan:  Kristi Avery is a 57 y.o. female Dizziness - Plan: EKG 12-Lead ok, Hx of Anemia - Plan: POCT CBC - stable. Suspect secondary to serous otitis. Sx care and rtc precautions discussed.   Acute upper respiratory infections of unspecified site - Plan: azithromycin (ZITHROMAX) 250 MG tablet Cough - Plan: benzonatate (TESSALON) 100 MG capsule, azithromycin (ZITHROMAX) 250 MG tablet Serous otitis media, bilateral:  Likely viral uri with PND causing cough at night. Reassuring exam and CBC,  chest clear.  Sx care, mucinex DM or tessalon if needed, Kristi Avery Rx given if not improving in next 3-4 days.  Rtc/er precautions given including if any change or worsening of chest sx's - understanding expressed.   Meds ordered this encounter  . benzonatate (TESSALON) 100 MG capsule    Sig: Take 1 capsule (100 mg total) by mouth 3 (three) times daily as needed for cough.    Dispense:  20 capsule    Refill:  0  . azithromycin (ZITHROMAX) 250 MG tablet    Sig: Take 2 pills by mouth on day 1, then 1 pill by mouth per day on days 2 through 5.    Dispense:  6 each    Refill:  0    Patient Instructions  The chest congestion and fluid behind the ears (likely cause of dizziness) appears to be due to a virus at this point. Your EKG and blood counts appeared ok.  You can take mucinex or mucinex dm for cough, or tessalon perles if needed to help block the cough.  If cough is not improving in the next 3-4 days - can fill antibiotic, but Return to the clinic or go to the nearest emergency room if any of your symptoms worsen or new symptoms occur.  Drink plenty of fluids, avoid decongestants as they can raise your blood pressure, but you can use saline nasal spray to help with nasal congestion that may be causing the pressure in your ears.   Cough, Adult  A cough is a reflex that helps clear your throat and airways. It can help heal the body or may be a reaction to an irritated airway. A cough may only last 2 or 3 weeks (acute) or may last more than 8 weeks (chronic).  CAUSES Acute cough:  Viral or bacterial infections. Chronic cough:  Infections.  Allergies.  Asthma.  Post-nasal drip.  Smoking.  Heartburn or acid reflux.  Some medicines.  Chronic lung problems (COPD).  Cancer. SYMPTOMS   Cough.  Fever.  Chest pain.  Increased breathing rate.  High-pitched whistling sound when breathing (wheezing).  Colored mucus that you cough up (sputum). TREATMENT   A bacterial cough may be  treated with antibiotic medicine.  A viral cough must run its course and will not respond to antibiotics.  Your caregiver may recommend other treatments if you have a chronic cough. HOME CARE INSTRUCTIONS   Only take over-the-counter or prescription medicines for pain, discomfort, or fever as directed by your caregiver. Use cough suppressants only as directed by your caregiver.  Use a cold steam vaporizer or humidifier in your bedroom or home to help loosen secretions.  Sleep in a semi-upright position if your cough is worse at night.  Rest as needed.  Stop smoking if you smoke. SEEK IMMEDIATE MEDICAL CARE IF:   You have pus in your sputum.  Your cough starts to worsen.  You cannot control your cough with suppressants and are losing sleep.  You begin coughing up blood.  You have difficulty breathing.  You develop pain which is getting worse or is uncontrolled with medicine.  You have a fever. MAKE SURE YOU:   Understand these instructions.  Will watch your condition.  Will get help right away if you are not doing well or get worse. Document Released: 04/08/2011 Document Revised: 01/02/2012 Document Reviewed: 04/08/2011 [facility] Patient Information 2014 [place], [place].

## 2013-04-18 NOTE — Patient Instructions (Signed)
The chest congestion and fluid behind the ears (likely cause of dizziness) appears to be due to a virus at this point. Your EKG and blood counts appeared ok.  You can take mucinex or mucinex dm for cough, or tessalon perles if needed to help block the cough.  If cough is not improving in the next 3-4 days - can fill antibiotic, but Return to the clinic or go to the nearest emergency room if any of your symptoms worsen or new symptoms occur.  Drink plenty of fluids, avoid decongestants as they can raise your blood pressure, but you can use saline nasal spray to help with nasal congestion that may be causing the pressure in your ears.   Cough, Adult  A cough is a reflex that helps clear your throat and airways. It can help heal the body or may be a reaction to an irritated airway. A cough may only last 2 or 3 weeks (acute) or may last more than 8 weeks (chronic).  CAUSES Acute cough:  Viral or bacterial infections. Chronic cough:  Infections.  Allergies.  Asthma.  Post-nasal drip.  Smoking.  Heartburn or acid reflux.  Some medicines.  Chronic lung problems (COPD).  Cancer. SYMPTOMS   Cough.  Fever.  Chest pain.  Increased breathing rate.  High-pitched whistling sound when breathing (wheezing).  Colored mucus that you cough up (sputum). TREATMENT   A bacterial cough may be treated with antibiotic medicine.  A viral cough must run its course and will not respond to antibiotics.  Your caregiver may recommend other treatments if you have a chronic cough. HOME CARE INSTRUCTIONS   Only take over-the-counter or prescription medicines for pain, discomfort, or fever as directed by your caregiver. Use cough suppressants only as directed by your caregiver.  Use a cold steam vaporizer or humidifier in your bedroom or home to help loosen secretions.  Sleep in a semi-upright position if your cough is worse at night.  Rest as needed.  Stop smoking if you smoke. SEEK  IMMEDIATE MEDICAL CARE IF:   You have pus in your sputum.  Your cough starts to worsen.  You cannot control your cough with suppressants and are losing sleep.  You begin coughing up blood.  You have difficulty breathing.  You develop pain which is getting worse or is uncontrolled with medicine.  You have a fever. MAKE SURE YOU:   Understand these instructions.  Will watch your condition.  Will get help right away if you are not doing well or get worse. Document Released: 04/08/2011 Document Revised: 01/02/2012 Document Reviewed: 04/08/2011 [facility] Patient Information 2014 [place], [place].

## 2013-05-21 ENCOUNTER — Ambulatory Visit (HOSPITAL_COMMUNITY)
Admission: RE | Admit: 2013-05-21 | Discharge: 2013-05-21 | Disposition: A | Discharge status: Home / Self Care | Payer: BC Managed Care – PPO | Source: Ambulatory Visit | Attending: Obstetrics and Gynecology | Admitting: Obstetrics and Gynecology

## 2013-05-21 DIAGNOSIS — Z1231 Encounter for screening mammogram for malignant neoplasm of breast: Secondary | ICD-10-CM

## 2013-08-17 ENCOUNTER — Ambulatory Visit (INDEPENDENT_AMBULATORY_CARE_PROVIDER_SITE_OTHER): Payer: BC Managed Care – PPO | Admitting: Family Medicine

## 2013-08-17 VITALS — BP 143/80 | HR 94 | Temp 98.2°F | Resp 18 | Wt 166.0 lb

## 2013-08-17 DIAGNOSIS — D649 Anemia, unspecified: Secondary | ICD-10-CM

## 2013-08-17 DIAGNOSIS — H9203 Otalgia, bilateral: Secondary | ICD-10-CM

## 2013-08-17 DIAGNOSIS — R059 Cough, unspecified: Secondary | ICD-10-CM

## 2013-08-17 DIAGNOSIS — R05 Cough: Secondary | ICD-10-CM

## 2013-08-17 DIAGNOSIS — H9209 Otalgia, unspecified ear: Secondary | ICD-10-CM

## 2013-08-17 DIAGNOSIS — R519 Headache, unspecified: Secondary | ICD-10-CM

## 2013-08-17 DIAGNOSIS — R42 Dizziness and giddiness: Secondary | ICD-10-CM

## 2013-08-17 LAB — POCT CBC
Granulocyte percent: 48.8 %G (ref 37–80)
HCT, POC: 36.6 % — AB (ref 37.7–47.9)
Hemoglobin: 11.7 g/dL — AB (ref 12.2–16.2)
Lymph, poc: 3.3 (ref 0.6–3.4)
MCH, POC: 30.2 pg (ref 27–31.2)
MCHC: 32 g/dL (ref 31.8–35.4)
MCV: 94.5 fL (ref 80–97)
MID (cbc): 0.5 (ref 0–0.9)
MPV: 8.6 fL (ref 0–99.8)
POC Granulocyte: 3.7 (ref 2–6.9)
POC LYMPH PERCENT: 44.1 %L (ref 10–50)
POC MID %: 7.1 %M (ref 0–12)
Platelet Count, POC: 376 10*3/uL (ref 142–424)
RBC: 3.87 M/uL — AB (ref 4.04–5.48)
RDW, POC: 13.9 %
WBC: 7.5 10*3/uL (ref 4.6–10.2)

## 2013-08-17 NOTE — Patient Instructions (Signed)

## 2013-08-17 NOTE — Progress Notes (Signed)
[facility]:  Office Visit  Chief Complaint:  Chief Complaint  Patient presents with  . Dizziness    HPI: Kristi Avery is a 57 y.o. female who is here for frontal HA, ear fullness/tightness, and took sinus pill without relief.+ dizziness , which she has had chronic problems with  and nausea x 1 week. Deneis fevers or chills Deneis SOB, + clear cough. She took a sinus pill.  She feels tired. No blood in urine or stool History of PUD and recently took Naproxen for work comp injury and is afraid she might have similar symptoms like she did when she had a bleeding ulcer. Wants to make sure that her blood levels are stable. Denies SOB, CP, blurred vision, diaphoeresis, numbness, weakness, tingling    Past Medical History  Diagnosis Date  . Abnormal Pap smear 1983  . GERD (gastroesophageal reflux disease)   . Menorrhagia     Hx  . Insomnia   . Psoriasis   . Diabetes mellitus without complication   . Hypertension   . HLD (hyperlipidemia)   . Anemia   . Arthritis   . Ulcer    Past Surgical History  Procedure Laterality Date  . Tubal ligation    . Kidney surgery      left  . Esophagogastroduodenoscopy  09/01/2012    Procedure: ESOPHAGOGASTRODUODENOSCOPY (EGD);  Surgeon: Louis Meckel, MD;  Location: [facility];  Service: Endoscopy;  Laterality: N/A;   History   Social History  . Marital Status: Married    Spouse Name: N/A    Number of Children: 2  . Years of Education: N/A   Occupational History  .     Social History Main Topics  . Smoking status: Current Every Day Smoker -- 0.50 packs/day for 35 years    Types: Cigarettes  . Smokeless tobacco: Never Used  . Alcohol Use: 0.0 oz/week    2-3 Glasses of wine per week  . Drug Use: No  . Sexual Activity: Yes    Birth Control/ Protection: Surgical     Comment: BTL   Other Topics Concern  . None   Social History Narrative  . None   Family History  Problem Relation Age of Onset  . Diabetes  Mother   . Heart disease Mother   . Stroke Father   . Colon cancer Neg Hx   . Esophageal cancer Neg Hx   . Rectal cancer Neg Hx   . Stomach cancer Neg Hx   . Diabetes Sister   . Diabetes Brother    Allergies  Allergen Reactions  . Ultram [Tramadol] Nausea And Vomiting  . Sulfonamide Derivatives Nausea And Vomiting   Prior to Admission medications   Medication Sig Start Date End Date Taking? Authorizing Provider  fosinopril (MONOPRIL) 40 MG tablet Take 40 mg by mouth daily.   Yes Historical Provider, MD  hydrochlorothiazide (HYDRODIURIL) 25 MG tablet Take 25 mg by mouth daily.   Yes Historical Provider, MD  metFORMIN (GLUCOPHAGE) 500 MG tablet Take 500 mg by mouth 2 (two) times daily with a meal.   Yes Historical Provider, MD  pantoprazole (PROTONIX) 40 MG tablet Take 40 mg by mouth daily.   Yes Historical Provider, MD  simvastatin (ZOCOR) 40 MG tablet Take 40 mg by mouth every evening.   Yes Historical Provider, MD  azithromycin (ZITHROMAX) 250 MG tablet Take 2 pills by mouth on day 1, then 1 pill by mouth per day on days 2 through 5.  04/18/13   Shade Flood, MD  benzonatate (TESSALON) 100 MG capsule Take 1 capsule (100 mg total) by mouth 3 (three) times daily as needed for cough. 04/18/13   Shade Flood, MD  ferrous sulfate 325 (65 FE) MG tablet Take 325 mg by mouth daily with breakfast. 09/02/12   Meredith Pel, NP     ROS: The patient denies fevers, chills, night sweats, unintentional weight loss, chest pain, palpitations, wheezing, dyspnea on exertion, nausea, vomiting, abdominal pain, dysuria, hematuria, melena, numbness, weakness, or tingling.  All other systems have been reviewed and were otherwise negative with the exception of those mentioned in the HPI and as above.    PHYSICAL EXAM: Filed Vitals:   08/17/13 1630  BP: 143/80  Pulse: 94  Temp: 98.2 F (36.8 C)  Resp: 18   Filed Vitals:   08/17/13 1630  Weight: 166 lb (75.297 kg)   Body mass index is  31.38 kg/(m^2).  General: Alert, no acute distress HEENT:  Normocephalic, atraumatic, oropharynx patent. EOMI, PERRLA, fundoscopic exam nl, no exudates, TM nl Cardiovascular:  Regular rate and rhythm, no rubs murmurs or gallops.  No Carotid bruits, radial pulse intact. No pedal edema.  Respiratory: Clear to auscultation bilaterally.  No wheezes, rales, or rhonchi.  No cyanosis, no use of accessory musculature GI: No organomegaly, abdomen is soft and non-tender, positive bowel sounds.  No masses. Skin: No rashes. Neurologic: Facial musculature symmetric. CN 2-12 grossly nl Psychiatric: Patient is appropriate throughout our interaction. Lymphatic: No cervical lymphadenopathy Musculoskeletal: Gait intact.   LABS: Results for orders placed in visit on 08/17/13  POCT CBC      Result Value Range   WBC 7.5  4.6 - 10.2 K/uL   Lymph, poc 3.3  0.6 - 3.4   POC LYMPH PERCENT 44.1  10 - 50 %L   MID (cbc) 0.5  0 - 0.9   POC MID % 7.1  0 - 12 %M   POC Granulocyte 3.7  2 - 6.9   Granulocyte percent 48.8  37 - 80 %G   RBC 3.87 (*) 4.04 - 5.48 M/uL   Hemoglobin 11.7 (*) 12.2 - 16.2 g/dL   HCT, POC 16.1 (*) 09.6 - 47.9 %   MCV 94.5  80 - 97 fL   MCH, POC 30.2  27 - 31.2 pg   MCHC 32.0  31.8 - 35.4 g/dL   RDW, POC 04.5     Platelet Count, POC 376  142 - 424 K/uL   MPV 8.6  0 - 99.8 fL     EKG/XRAY:   Primary read interpreted by Dr. Conley Rolls at [facility].   ASSESSMENT/PLAN: Encounter Diagnoses  Name Primary?  . Dizziness and giddiness Yes  . Otalgia, bilateral   . Cough   . Sinus headache   . Anemia    Kristi Avery is here with early sinus sxs and allergy sxs. She is actually more concerned about having another GIB after taking 3 doses of naproxen which she had for a work comp case. About 11 months ago she had a bleeding ulcer and bled out in her kitchen and had to go to the hospital, they found her Hgb to be 6.5. She os afraid that she is exhibiting similar sxs again since taking naproxen. I have  reassured her that her Hgb is ok right now, she will stop taking naproxen, take tylenol prn and continue with her PPI. We will get a future order for repeat CBC in 1 week.  OTC nasocort Protonix CBC in future for anemia Tylenol for sinus HAs F/u prn  Gross sideeffects, risk and benefits, and alternatives of medications d/w patient. Patient is aware that all medications have potential sideeffects and we are unable to predict every sideeffect or drug-drug interaction that may occur.  Kristi Capri PHUONG, DO 08/17/2013 5:35 PM

## 2014-02-15 ENCOUNTER — Ambulatory Visit (INDEPENDENT_AMBULATORY_CARE_PROVIDER_SITE_OTHER): Payer: BC Managed Care – PPO | Admitting: Family Medicine

## 2014-02-15 VITALS — BP 138/68 | HR 85 | Temp 98.2°F | Resp 16 | Ht 61.0 in | Wt 161.0 lb

## 2014-02-15 DIAGNOSIS — M542 Cervicalgia: Secondary | ICD-10-CM

## 2014-02-15 DIAGNOSIS — M62838 Other muscle spasm: Secondary | ICD-10-CM

## 2014-02-15 MED ORDER — CYCLOBENZAPRINE HCL 5 MG PO TABS: ORAL_TABLET | ORAL | Status: DC

## 2014-02-15 NOTE — Patient Instructions (Signed)
You likely have a pinched nerve in the neck - sometimes due to arthritis or even a disc problem. Can try muscle relaxant, heat or ice, gentle range of motion and recheck in next 4-5 days if not improving.  Sooner if worse - especially if any rash, weakness in arms or other worsening.   Muscle Cramps and Spasms Muscle cramps and spasms occur when a muscle or muscles tighten and you have no control over this tightening (involuntary muscle contraction). They are a common problem and can develop in any muscle. The most common place is in the calf muscles of the leg. Both muscle cramps and muscle spasms are involuntary muscle contractions, but they also have differences:   Muscle cramps are sporadic and painful. They may last a few seconds to a quarter of an hour. Muscle cramps are often more forceful and last longer than muscle spasms.  Muscle spasms may or may not be painful. They may also last just a few seconds or much longer. CAUSES  It is uncommon for cramps or spasms to be due to a serious underlying problem. In many cases, the cause of cramps or spasms is unknown. Some common causes are:   Overexertion.   Overuse from repetitive motions (doing the same thing over and over).   Remaining in a certain position for a long period of time.   Improper preparation, form, or technique while performing a sport or activity.   Dehydration.   Injury.   Side effects of some medicines.   Abnormally low levels of the salts and ions in your blood (electrolytes), especially potassium and calcium. This could happen if you are taking water pills (diuretics) or you are pregnant.  Some underlying medical problems can make it more likely to develop cramps or spasms. These include, but are not limited to:   Diabetes.   Parkinson disease.   Hormone disorders, such as thyroid problems.   Alcohol abuse.   Diseases specific to muscles, joints, and bones.   Blood vessel disease where not  enough blood is getting to the muscles.  HOME CARE INSTRUCTIONS   Stay well hydrated. Drink enough water and fluids to keep your urine clear or pale yellow.  It may be helpful to massage, stretch, and relax the affected muscle.  For tight or tense muscles, use a warm towel, heating pad, or hot shower water directed to the affected area.  If you are sore or have pain after a cramp or spasm, applying ice to the affected area may relieve discomfort.  Put ice in a plastic bag.  Place a towel between your skin and the bag.  Leave the ice on for 15-20 minutes, 03-04 times a day.  Medicines used to treat a known cause of cramps or spasms may help reduce their frequency or severity. Only take over-the-counter or prescription medicines as directed by your caregiver. SEEK MEDICAL CARE IF:  Your cramps or spasms get more severe, more frequent, or do not improve over time.  MAKE SURE YOU:   Understand these instructions.  Will watch your condition.  Will get help right away if you are not doing well or get worse. Document Released: 04/01/2002 Document Revised: 02/04/2013 Document Reviewed: 09/26/2012 [facility] Patient Information 2014 [place], [place].

## 2014-02-15 NOTE — Progress Notes (Signed)
Subjective:    Patient ID: Kristi Avery, female    DOB: July 08, 1956, 58 y.o.   MRN: [id number]  Neck Pain  Pertinent negatives include no chest pain, fever or photophobia.   Chief Complaint  Patient presents with  . Neck Pain    today, right side    This chart was scribed for Merri Ray, MD by Thea Alken, ED Scribe. This patient was seen in room 14 and the patient's care was started at 3:29 PM.  HPI Comments: Kristi Avery is a 58 y.o. female who presents to the [facility] complaining of right sided neck pain onset this morning. She reports that she woke up this morning fine. She states that when she got up from watch tv and turned her neck to the right she had felt a right sided neck pain. She reports that she has tried a heating pad and ice pack to neck without relief. She reports that the pain radiates up to back of head. She reports a right sided HA. She reports h/o crocks in her necks but reports that none have lasted this long. She denies h/o of neck problems. Pt denies nausea, vomiting, fever, chill, CP, SOB, blurry vision and photophobia.    Past Medical History  Diagnosis Date  . Abnormal Pap smear 1983  . GERD (gastroesophageal reflux disease)   . Menorrhagia     Hx  . Insomnia   . Psoriasis   . Diabetes mellitus without complication   . Hypertension   . HLD (hyperlipidemia)   . Anemia   . Arthritis   . Ulcer    Allergies  Allergen Reactions  . Ultram [Tramadol] Nausea And Vomiting  . Sulfonamide Derivatives Nausea And Vomiting   Prior to Admission medications   Medication Sig Start Date End Date Taking? Authorizing Provider  ferrous sulfate 325 (65 FE) MG tablet Take 325 mg by mouth daily with breakfast. 09/02/12  Yes Willia Craze, NP  fosinopril (MONOPRIL) 40 MG tablet Take 40 mg by mouth daily.   Yes Historical Provider, MD  hydrochlorothiazide (HYDRODIURIL) 25 MG tablet Take 25 mg by mouth daily.   Yes Historical Provider, MD    metFORMIN (GLUCOPHAGE) 500 MG tablet Take 500 mg by mouth 2 (two) times daily with a meal.   Yes Historical Provider, MD  pantoprazole (PROTONIX) 40 MG tablet Take 40 mg by mouth daily.   Yes Historical Provider, MD  simvastatin (ZOCOR) 40 MG tablet Take 40 mg by mouth every evening.   Yes Historical Provider, MD  azithromycin (ZITHROMAX) 250 MG tablet Take 2 pills by mouth on day 1, then 1 pill by mouth per day on days 2 through 5. 04/18/13   Wendie Agreste, MD  benzonatate (TESSALON) 100 MG capsule Take 1 capsule (100 mg total) by mouth 3 (three) times daily as needed for cough. 04/18/13   Wendie Agreste, MD   Review of Systems  Constitutional: Negative for fever and chills.  Eyes: Negative for photophobia and visual disturbance.  Respiratory: Negative for shortness of breath.   Cardiovascular: Negative for chest pain.  Gastrointestinal: Negative for nausea and vomiting.  Musculoskeletal: Positive for neck pain.     Review of Systems   Objective:   Physical Exam  Constitutional: She appears well-developed and well-nourished. No distress.  HENT:  Ears are normal  Neck: Normal range of motion.  No lymphadenopathy   TTP with spasm of right paraspinal  No midline bony tenderness Flexion  intact Decreased extension  Left rotation intact Right rotation intact but with pain Pain with right lateral flexion  Cardiovascular: Normal rate, regular rhythm and normal heart sounds.  Exam reveals no gallop and no friction rub.   No murmur heard. Pulmonary/Chest: Effort normal and breath sounds normal.  Neurological:  Reflex Scores:      Tricep reflexes are 1+ on the right side and 1+ on the left side.      Bicep reflexes are 1+ on the right side and 1+ on the left side.      Brachioradialis reflexes are 1+ on the right side and 1+ on the left side. Grip strength intact and equal bilaterally Biceps and  triceps are at equal strength   Skin: No rash noted.      Assessment & Plan:     Kristi Avery is a 58 y.o. female Neck pain on right side - Plan: cyclobenzaprine (FLEXERIL) 5 MG tablet  Muscle spasms of neck - Plan: cyclobenzaprine (FLEXERIL) 5 MG tablet  Suspected djd with pinched nerve vs discogenic pain, given prior lumbar DDD by hx.  Strength intact, reflexes equal and early sx's - XR deferred at present - sx care with flexeril (SED) and tylenol (avoiding NSAIDS given hx PUD). If not improving in few days - recheck.  rtc precautions.   Meds ordered this encounter  Medications  . cyclobenzaprine (FLEXERIL) 5 MG tablet    Sig: 1 pill by mouth up to every 8 hours as needed. Start with one pill by mouth each bedtime as needed due to sedation    Dispense:  15 tablet    Refill:  0   Patient Instructions  You likely have a pinched nerve in the neck - sometimes due to arthritis or even a disc problem. Can try muscle relaxant, heat or ice, gentle range of motion and recheck in next 4-5 days if not improving.  Sooner if worse - especially if any rash, weakness in arms or other worsening.   Muscle Cramps and Spasms Muscle cramps and spasms occur when a muscle or muscles tighten and you have no control over this tightening (involuntary muscle contraction). They are a common problem and can develop in any muscle. The most common place is in the calf muscles of the leg. Both muscle cramps and muscle spasms are involuntary muscle contractions, but they also have differences:   Muscle cramps are sporadic and painful. They may last a few seconds to a quarter of an hour. Muscle cramps are often more forceful and last longer than muscle spasms.  Muscle spasms may or may not be painful. They may also last just a few seconds or much longer. CAUSES  It is uncommon for cramps or spasms to be due to a serious underlying problem. In many cases, the cause of cramps or spasms is unknown. Some common causes are:   Overexertion.   Overuse from repetitive motions (doing the same thing  over and over).   Remaining in a certain position for a long period of time.   Improper preparation, form, or technique while performing a sport or activity.   Dehydration.   Injury.   Side effects of some medicines.   Abnormally low levels of the salts and ions in your blood (electrolytes), especially potassium and calcium. This could happen if you are taking water pills (diuretics) or you are pregnant.  Some underlying medical problems can make it more likely to develop cramps or spasms. These include, but are not  limited to:   Diabetes.   Parkinson disease.   Hormone disorders, such as thyroid problems.   Alcohol abuse.   Diseases specific to muscles, joints, and bones.   Blood vessel disease where not enough blood is getting to the muscles.  HOME CARE INSTRUCTIONS   Stay well hydrated. Drink enough water and fluids to keep your urine clear or pale yellow.  It may be helpful to massage, stretch, and relax the affected muscle.  For tight or tense muscles, use a warm towel, heating pad, or hot shower water directed to the affected area.  If you are sore or have pain after a cramp or spasm, applying ice to the affected area may relieve discomfort.  Put ice in a plastic bag.  Place a towel between your skin and the bag.  Leave the ice on for 15-20 minutes, 03-04 times a day.  Medicines used to treat a known cause of cramps or spasms may help reduce their frequency or severity. Only take over-the-counter or prescription medicines as directed by your caregiver. SEEK MEDICAL CARE IF:  Your cramps or spasms get more severe, more frequent, or do not improve over time.  MAKE SURE YOU:   Understand these instructions.  Will watch your condition.  Will get help right away if you are not doing well or get worse. Document Released: 04/01/2002 Document Revised: 02/04/2013 Document Reviewed: 09/26/2012 [facility] Patient Information 2014 [place], [place].   I  personally performed the services described in this documentation, which was scribed in my presence. The recorded information has been reviewed and considered, and addended by me as needed.

## 2014-05-19 ENCOUNTER — Other Ambulatory Visit: Payer: Self-pay | Admitting: Obstetrics and Gynecology

## 2014-05-19 DIAGNOSIS — Z1231 Encounter for screening mammogram for malignant neoplasm of breast: Secondary | ICD-10-CM

## 2014-05-23 ENCOUNTER — Ambulatory Visit (HOSPITAL_COMMUNITY)
Admission: RE | Admit: 2014-05-23 | Discharge: 2014-05-23 | Disposition: A | Discharge status: Home / Self Care | Payer: BC Managed Care – PPO | Source: Ambulatory Visit | Attending: Obstetrics and Gynecology | Admitting: Obstetrics and Gynecology

## 2014-05-23 DIAGNOSIS — Z1231 Encounter for screening mammogram for malignant neoplasm of breast: Secondary | ICD-10-CM | POA: Insufficient documentation

## 2015-05-06 ENCOUNTER — Encounter: Payer: Self-pay | Admitting: Internal Medicine

## 2015-07-01 ENCOUNTER — Other Ambulatory Visit: Payer: Self-pay | Admitting: Obstetrics and Gynecology

## 2015-07-01 DIAGNOSIS — R928 Other abnormal and inconclusive findings on diagnostic imaging of breast: Secondary | ICD-10-CM

## 2015-07-07 ENCOUNTER — Other Ambulatory Visit: Payer: Self-pay

## 2015-07-14 ENCOUNTER — Ambulatory Visit
Admission: RE | Admit: 2015-07-14 | Discharge: 2015-07-14 | Disposition: A | Discharge status: Home / Self Care | Payer: BLUE CROSS/BLUE SHIELD | Source: Ambulatory Visit | Attending: Obstetrics and Gynecology | Admitting: Obstetrics and Gynecology

## 2015-07-14 DIAGNOSIS — R928 Other abnormal and inconclusive findings on diagnostic imaging of breast: Secondary | ICD-10-CM

## 2015-10-02 ENCOUNTER — Encounter: Payer: Self-pay | Admitting: Internal Medicine

## 2015-11-30 ENCOUNTER — Encounter: Payer: Self-pay | Admitting: Internal Medicine

## 2015-12-03 ENCOUNTER — Other Ambulatory Visit: Payer: Self-pay | Admitting: Obstetrics and Gynecology

## 2015-12-03 DIAGNOSIS — N63 Unspecified lump in unspecified breast: Secondary | ICD-10-CM

## 2015-12-29 ENCOUNTER — Ambulatory Visit
Admission: RE | Admit: 2015-12-29 | Discharge: 2015-12-29 | Disposition: A | Discharge status: Home / Self Care | Payer: BLUE CROSS/BLUE SHIELD | Source: Ambulatory Visit | Attending: Obstetrics and Gynecology | Admitting: Obstetrics and Gynecology

## 2015-12-29 DIAGNOSIS — N63 Unspecified lump in unspecified breast: Secondary | ICD-10-CM

## 2016-01-14 ENCOUNTER — Ambulatory Visit (AMBULATORY_SURGERY_CENTER): Payer: Self-pay

## 2016-01-14 VITALS — Ht 61.0 in | Wt 159.0 lb

## 2016-01-14 DIAGNOSIS — Z1211 Encounter for screening for malignant neoplasm of colon: Secondary | ICD-10-CM

## 2016-01-14 NOTE — Progress Notes (Signed)
No egg or soy allergies Not on home 02 No previous anesthesia complications No diet or weight loss meds 

## 2016-01-28 ENCOUNTER — Ambulatory Visit (AMBULATORY_SURGERY_CENTER): Payer: BLUE CROSS/BLUE SHIELD | Admitting: Internal Medicine

## 2016-01-28 ENCOUNTER — Encounter: Payer: Self-pay | Admitting: Internal Medicine

## 2016-01-28 VITALS — BP 150/66 | HR 54 | Temp 98.0°F | Resp 22 | Ht 61.0 in | Wt 159.0 lb

## 2016-01-28 DIAGNOSIS — Z1211 Encounter for screening for malignant neoplasm of colon: Secondary | ICD-10-CM

## 2016-01-28 LAB — GLUCOSE, CAPILLARY
Glucose-Capillary: 177 mg/dL — ABNORMAL HIGH (ref 65–99)
Glucose-Capillary: 150 mg/dL — ABNORMAL HIGH (ref 65–99)

## 2016-01-28 MED ORDER — SODIUM CHLORIDE 0.9 % IV SOLN: 500.0000 mL | INTRAVENOUS | Status: DC

## 2016-01-28 NOTE — Patient Instructions (Addendum)
No polyps or cancer seen!  Next routine colonoscopy/screening test in 10 years - 2027  I did see internal hemorrhoids. If you have hemorrhoid problems (swelling, itching, bleeding) I am able to treat those with an in-office procedure. If you like, please call my office at [phone] to schedule an appointment and I can evaluate you further.   I appreciate the opportunity to care for you. Gatha Mayer, MD, [facility]   Discharge instructions given. Handout on hemorrhoids. Resume previous medications. YOU HAD AN ENDOSCOPIC PROCEDURE TODAY AT [facility]:   Refer to the procedure report that was given to you for any specific questions about what was found during the examination.  If the procedure report does not answer your questions, please call your gastroenterologist to clarify.  If you requested that your care partner not be given the details of your procedure findings, then the procedure report has been included in a sealed envelope for you to review at your convenience later.  YOU SHOULD EXPECT: Some feelings of bloating in the abdomen. Passage of more gas than usual.  Walking can help get rid of the air that was put into your GI tract during the procedure and reduce the bloating. If you had a lower endoscopy (such as a colonoscopy or flexible sigmoidoscopy) you may notice spotting of blood in your stool or on the toilet paper. If you underwent a bowel prep for your procedure, you may not have a normal bowel movement for a few days.  Please Note:  You might notice some irritation and congestion in your nose or some drainage.  This is from the oxygen used during your procedure.  There is no need for concern and it should clear up in a day or so.  SYMPTOMS TO REPORT IMMEDIATELY:   Following lower endoscopy (colonoscopy or flexible sigmoidoscopy):  Excessive amounts of blood in the stool  Significant tenderness or worsening of abdominal pains  Swelling of the abdomen  that is new, acute  Fever of 100F or higher  For urgent or emergent issues, a gastroenterologist can be reached at any hour by calling [phone].   DIET: Your first meal following the procedure should be a small meal and then it is ok to progress to your normal diet. Heavy or fried foods are harder to digest and may make you feel nauseous or bloated.  Likewise, meals heavy in dairy and vegetables can increase bloating.  Drink plenty of fluids but you should avoid alcoholic beverages for 24 hours.  ACTIVITY:  You should plan to take it easy for the rest of today and you should NOT DRIVE or use heavy machinery until tomorrow (because of the sedation medicines used during the test).    FOLLOW UP: Our staff will call the number listed on your records the next business day following your procedure to check on you and address any questions or concerns that you may have regarding the information given to you following your procedure. If we do not reach you, we will leave a message.  However, if you are feeling well and you are not experiencing any problems, there is no need to return our call.  We will assume that you have returned to your regular daily activities without incident.  If any biopsies were taken you will be contacted by phone or by letter within the next 1-3 weeks.  Please call us at [phone] if you have not heard about the biopsies in 3 weeks.  SIGNATURES/CONFIDENTIALITY: You and/or your care partner have signed paperwork which will be entered into your electronic medical record.  These signatures attest to the fact that that the information above on your After Visit Summary has been reviewed and is understood.  Full responsibility of the confidentiality of this discharge information lies with you and/or your care-partner.

## 2016-01-28 NOTE — Op Note (Signed)
[place] Patient Name: Kristi Avery Procedure Date: 01/28/2016 10:16 AM MRN: [id number] Endoscopist: Gatha Mayer , MD Age: 60 Date of Birth: 10/13/1956 Gender: Female Procedure:                Colonoscopy Indications:              Screening for colorectal malignant neoplasm Medicines:                Propofol per Anesthesia, Monitored Anesthesia Care Procedure:                Pre-Anesthesia Assessment:                           - Prior to the procedure, a History and Physical                            was performed, and patient medications and                            allergies were reviewed. The patient's tolerance of                            previous anesthesia was also reviewed. The risks                            and benefits of the procedure and the sedation                            options and risks were discussed with the patient.                            All questions were answered, and informed consent                            was obtained. Prior Anticoagulants: The patient has                            taken no previous anticoagulant or antiplatelet                            agents. ASA Grade Assessment: II - A patient with                            mild systemic disease. After reviewing the risks                            and benefits, the patient was deemed in                            satisfactory condition to undergo the procedure.                           After obtaining informed consent, the colonoscope  was passed under direct vision. Throughout the                            procedure, the patient's blood pressure, pulse, and                            oxygen saturations were monitored continuously. The                            Model CF-HQ190L [id number]) scope was introduced                            through the anus and advanced to the the cecum,                            identified by appendiceal orifice  and ileocecal                            valve. The colonoscopy was performed without                            difficulty. The patient tolerated the procedure                            well. The quality of the bowel preparation was                            good. The bowel preparation used was Miralax. The                            ileocecal valve, appendiceal orifice, and rectum                            were photographed. Scope In: 10:29:31 AM Scope Out: 10:40:54 AM Scope Withdrawal Time: 0 hours 9 minutes 12 seconds  Total Procedure Duration: 0 hours 11 minutes 23 seconds  Findings:                 The perianal and digital rectal examinations were                            normal.                           Internal hemorrhoids were found during retroflexion.                           The exam was otherwise without abnormality on                            direct and retroflexion views. Complications:            No immediate complications. Estimated Blood Loss:     Estimated blood loss: none. Impression:               - Internal hemorrhoids.                           -  The examination was otherwise normal on direct                            and retroflexion views.                           - No specimens collected. Recommendation:           - Patient has a contact number available for                            emergencies. The signs and symptoms of potential                            delayed complications were discussed with the                            patient. Return to normal activities tomorrow.                            Written discharge instructions were provided to the                            patient.                           - Resume previous diet.                           - Continue present medications.                           - Await pathology results.                           - Repeat colonoscopy in 10 years for screening                             purposes. Gatha Mayer, MD 01/28/2016 10:47:13 AM This report has been signed electronically. CC Letter to:             Shon Baton, MD

## 2016-01-28 NOTE — Progress Notes (Signed)
Report to PACU, RN, vss, BBS= Clear.  

## 2016-01-29 ENCOUNTER — Telehealth: Payer: Self-pay | Admitting: *Deleted

## 2016-01-29 NOTE — Telephone Encounter (Signed)
  Follow up Call-  Call back number 01/28/2016  Post procedure Call Back phone  # [phone]  Permission to leave phone message Yes     Patient questions:  Do you have a fever, pain , or abdominal swelling? No. Pain Score  0 *  Have you tolerated food without any problems? Yes.    Have you been able to return to your normal activities? Yes.    Do you have any questions about your discharge instructions: Diet   No. Medications  No. Follow up visit  No.  Do you have questions or concerns about your Care? No.  Actions: * If pain score is 4 or above: No action needed, pain <4.

## 2016-05-30 ENCOUNTER — Other Ambulatory Visit: Payer: Self-pay | Admitting: Obstetrics and Gynecology

## 2016-05-30 DIAGNOSIS — R921 Mammographic calcification found on diagnostic imaging of breast: Secondary | ICD-10-CM

## 2016-07-14 ENCOUNTER — Ambulatory Visit
Admission: RE | Admit: 2016-07-14 | Discharge: 2016-07-14 | Disposition: A | Discharge status: Home / Self Care | Payer: BLUE CROSS/BLUE SHIELD | Source: Ambulatory Visit | Attending: Obstetrics and Gynecology | Admitting: Obstetrics and Gynecology

## 2016-07-14 DIAGNOSIS — R921 Mammographic calcification found on diagnostic imaging of breast: Secondary | ICD-10-CM

## 2016-10-31 ENCOUNTER — Ambulatory Visit (INDEPENDENT_AMBULATORY_CARE_PROVIDER_SITE_OTHER): Payer: BLUE CROSS/BLUE SHIELD | Admitting: Emergency Medicine

## 2016-10-31 VITALS — BP 162/102 | HR 76 | Temp 97.5°F | Resp 18 | Ht 61.0 in | Wt 151.0 lb

## 2016-10-31 DIAGNOSIS — R350 Frequency of micturition: Secondary | ICD-10-CM | POA: Diagnosis not present

## 2016-10-31 DIAGNOSIS — M545 Low back pain, unspecified: Secondary | ICD-10-CM

## 2016-10-31 DIAGNOSIS — B999 Unspecified infectious disease: Secondary | ICD-10-CM

## 2016-10-31 DIAGNOSIS — R300 Dysuria: Secondary | ICD-10-CM | POA: Diagnosis not present

## 2016-10-31 LAB — POCT URINALYSIS DIP (MANUAL ENTRY)
Bilirubin, UA: NEGATIVE
Glucose, UA: NEGATIVE
Ketones, POC UA: NEGATIVE
NITRITE UA: NEGATIVE
PH UA: 6
RBC UA: NEGATIVE
Spec Grav, UA: 1.01
UROBILINOGEN UA: 0.2

## 2016-10-31 LAB — POC MICROSCOPIC URINALYSIS (UMFC): Mucus: ABSENT

## 2016-10-31 MED ORDER — NAPROXEN 500 MG PO TABS: 500.0000 mg | ORAL_TABLET | Freq: Two times a day (BID) | ORAL | 0 refills | Status: AC

## 2016-10-31 MED ORDER — CIPROFLOXACIN HCL 500 MG PO TABS: 500.0000 mg | ORAL_TABLET | Freq: Two times a day (BID) | ORAL | 0 refills | Status: AC

## 2016-10-31 NOTE — Progress Notes (Signed)
Kristi Avery 61 y.o.   Chief Complaint  Patient presents with  . Urinary Frequency    with pain x2 days  . Back Pain    lower back pain    HISTORY OF PRESENT ILLNESS: This is a 61 y.o. female complaining of UTI symptoms for several days followed 2-3 days ago by left sided flank pain; denies fever, chills, n/v. Back pain worse with movement.  Urinary Frequency   This is a new problem. The current episode started in the past 7 days. The problem occurs intermittently. The problem has been gradually worsening. The quality of the pain is described as aching and burning. The pain is at a severity of 3/10. The pain is mild. There has been no fever. There is no history of pyelonephritis. Associated symptoms include flank pain, frequency and urgency. Pertinent negatives include no chills, discharge, nausea or vomiting. The treatment provided no relief. Her past medical history is significant for recurrent UTIs.  Back Pain  Associated symptoms include dysuria. Pertinent negatives include no abdominal pain, fever or headaches.     Prior to Admission medications   Medication Sig Start Date End Date Taking? Authorizing Provider  irbesartan (AVAPRO) 300 MG tablet Take 300 mg by mouth daily.   Yes Historical Provider, MD  metFORMIN (GLUCOPHAGE) 500 MG tablet Take 500 mg by mouth 2 (two) times daily with a meal.   Yes Historical Provider, MD  methotrexate (RHEUMATREX) 2.5 MG tablet Take 2.5 mg by mouth once a week. Caution:Chemotherapy. Protect from light.   Yes Historical Provider, MD  pantoprazole (PROTONIX) 40 MG tablet Take 40 mg by mouth daily.   Yes Historical Provider, MD  simvastatin (ZOCOR) 40 MG tablet Take 40 mg by mouth every evening.   Yes Historical Provider, MD  fosinopril (MONOPRIL) 40 MG tablet Take 40 mg by mouth daily.    Historical Provider, MD    Allergies  Allergen Reactions  . Ultram [Tramadol] Nausea And Vomiting  . Sulfonamide Derivatives Nausea And Vomiting    Patient  Active Problem List   Diagnosis Date Noted  . Fibroids 03/28/2012  . Menopausal symptoms 03/28/2012  . Psoriasis 03/28/2012  . Type II diabetes mellitus ([place]) 03/28/2012  . Hypertension 03/28/2012  . Hyperlipidemia 03/28/2012  . Insomnia   . GERD (gastroesophageal reflux disease)     Past Medical History:  Diagnosis Date  . Abnormal Pap smear 1983  . Anemia   . Arthritis   . Diabetes mellitus without complication ([place])   . Gastric ulcer with hemorrhage 09/01/2012  . GERD (gastroesophageal reflux disease)   . HLD (hyperlipidemia)   . Hypertension   . Insomnia   . Menorrhagia    Hx  . Psoriasis   . Ulcer [facility])     Past Surgical History:  Procedure Laterality Date  . COLONOSCOPY  2007  . ESOPHAGOGASTRODUODENOSCOPY  09/01/2012   Procedure: ESOPHAGOGASTRODUODENOSCOPY (EGD);  Surgeon: Inda Castle, MD;  Location: [facility];  Service: Endoscopy;  Laterality: N/A;  . KIDNEY SURGERY     left  . TUBAL LIGATION      Social History   Social History  . Marital status: Married    Spouse name: N/A  . Number of children: 2  . Years of education: N/A   Occupational History  .  [employer]   Social History Main Topics  . Smoking status: Current Every Day Smoker    Packs/day: 0.50    Years: 35.00    Types: Cigarettes  .  Smokeless tobacco: Never Used  . Alcohol use 0.0 oz/week    2 - 3 Glasses of wine per week  . Drug use: No  . Sexual activity: Yes    Birth control/ protection: Surgical     Comment: BTL   Other Topics Concern  . Not on file   Social History Narrative  . No narrative on file    Family History  Problem Relation Age of Onset  . Diabetes Mother   . Heart disease Mother   . Stroke Father   . Diabetes Sister   . Diabetes Brother   . Colon cancer Neg Hx   . Esophageal cancer Neg Hx   . Rectal cancer Neg Hx   . Stomach cancer Neg Hx      Review of Systems  Constitutional: Negative for chills, fever and malaise/fatigue.    HENT: Negative.   Eyes: Negative.   Respiratory: Negative.   Cardiovascular: Negative.   Gastrointestinal: Negative for abdominal pain, blood in stool, diarrhea, nausea and vomiting.  Genitourinary: Positive for dysuria, flank pain, frequency and urgency.  Musculoskeletal: Positive for back pain.  Skin: Negative.   Neurological: Negative for dizziness and headaches.  Endo/Heme/Allergies: Negative.   Psychiatric/Behavioral: Negative.   All other systems reviewed and are negative.  Vitals:   10/31/16 0920  BP: (!) 162/102  Pulse: 76  Resp: 18  Temp: 97.5 F (36.4 C)     Physical Exam  Constitutional: She is oriented to person, place, and time. She appears well-developed and well-nourished.  HENT:  Head: Normocephalic and atraumatic.  Nose: Nose normal.  Mouth/Throat: Oropharynx is clear and moist.  Eyes: Conjunctivae and EOM are normal. Pupils are equal, round, and reactive to light.  Neck: Normal range of motion. Neck supple.  Cardiovascular: Normal rate, regular rhythm, normal heart sounds and intact distal pulses.   Pulmonary/Chest: Effort normal and breath sounds normal.  Abdominal: Soft. Bowel sounds are normal.  Musculoskeletal:       Lumbar back: She exhibits decreased range of motion and tenderness.       Arms: Neurological: She is alert and oriented to person, place, and time. No sensory deficit. She exhibits normal muscle tone.  Skin: Skin is warm and dry. Capillary refill takes less than 2 seconds.  Psychiatric: She has a normal mood and affect. Her behavior is normal.  Vitals reviewed.    ASSESSMENT & PLAN: Kristi Avery was seen today for urinary frequency and back pain.  Diagnoses and all orders for this visit:  Dysuria -     Urine culture  Urinary frequency -     POCT urinalysis dipstick -     POCT Microscopic Urinalysis ([facility])  Clinical infection  Acute left-sided low back pain without sciatica  Other orders -     ciprofloxacin (CIPRO) 500 MG  tablet; Take 1 tablet (500 mg total) by mouth 2 (two) times daily. -     naproxen (NAPROSYN) 500 MG tablet; Take 1 tablet (500 mg total) by mouth 2 (two) times daily with a meal.   Pt has UTI clinically. Will start Cipro and check urine culture.Back pain most likely musculoskeletal, doubt early pyelonephritis.   Agustina Caroli, MD [facility]

## 2016-10-31 NOTE — Patient Instructions (Addendum)
IF you received an x-ray today, you will receive an invoice from [facility]. Please contact [facility] at [phone] with questions or concerns regarding your invoice.   IF you received labwork today, you will receive an invoice from [facility]. Please contact [facility] at [phone] with questions or concerns regarding your invoice.   Our billing staff will not be able to assist you with questions regarding bills from these companies.  You will be contacted with the lab results as soon as they are available. The fastest way to get your results is to activate your My Chart account. Instructions are located on the last page of this paperwork. If you have not heard from [place] regarding the results in 2 weeks, please contact this office.      Back Pain, Adult Introduction Back pain is very common. The pain often gets better over time. The cause of back pain is usually not dangerous. Most people can learn to manage their back pain on their own. Follow these instructions at home: Watch your back pain for any changes. The following actions may help to lessen any pain you are feeling:  Stay active. Start with short walks on flat ground if you can. Try to walk farther each day.  Exercise regularly as told by your doctor. Exercise helps your back heal faster. It also helps avoid future injury by keeping your muscles strong and flexible.  Do not sit, drive, or stand in one place for more than 30 minutes.  Do not stay in bed. Resting more than 1-2 days can slow down your recovery.  Be careful when you bend or lift an object. Use good form when lifting:  Bend at your knees.  Keep the object close to your body.  Do not twist.  Sleep on a firm mattress. Lie on your side, and bend your knees. If you lie on your back, put a pillow under your knees.  Take medicines only as told by your doctor.  Put ice on the injured area.  Put ice in a plastic bag.  Place a towel  between your skin and the bag.  Leave the ice on for 20 minutes, 2-3 times a day for the first 2-3 days. After that, you can switch between ice and heat packs.  Avoid feeling anxious or stressed. Find good ways to deal with stress, such as exercise.  Maintain a healthy weight. Extra weight puts stress on your back. Contact a doctor if:  You have pain that does not go away with rest or medicine.  You have worsening pain that goes down into your legs or buttocks.  You have pain that does not get better in one week.  You have pain at night.  You lose weight.  You have a fever or chills. Get help right away if:  You cannot control when you poop (bowel movement) or pee (urinate).  Your arms or legs feel weak.  Your arms or legs lose feeling (numbness).  You feel sick to your stomach (nauseous) or throw up (vomit).  You have belly (abdominal) pain.  You feel like you may pass out (faint). This information is not intended to replace advice given to you by your health care provider. Make sure you discuss any questions you have with your health care provider. Document Released: 03/28/2008 Document Revised: 03/17/2016 Document Reviewed: 02/11/2014  2017 Elsevier  Urinary Tract Infection, Adult Introduction A urinary tract infection (UTI) is an infection of any part of the urinary  tract. The urinary tract includes the:  Kidneys.  Ureters.  Bladder.  Urethra. These organs make, store, and get rid of pee (urine) in the body. Follow these instructions at home:  Take over-the-counter and prescription medicines only as told by your doctor.  If you were prescribed an antibiotic medicine, take it as told by your doctor. Do not stop taking the antibiotic even if you start to feel better.  Avoid the following drinks:  Alcohol.  Caffeine.  Tea.  Carbonated drinks.  Drink enough fluid to keep your pee clear or pale yellow.  Keep all follow-up visits as told by your  doctor. This is important.  Make sure to:  Empty your bladder often and completely. Do not to hold pee for long periods of time.  Empty your bladder before and after sex.  Wipe from front to back after a bowel movement if you are female. Use each tissue one time when you wipe. Contact a doctor if:  You have back pain.  You have a fever.  You feel sick to your stomach (nauseous).  You throw up (vomit).  Your symptoms do not get better after 3 days.  Your symptoms go away and then come back. Get help right away if:  You have very bad back pain.  You have very bad lower belly (abdominal) pain.  You are throwing up and cannot keep down any medicines or water. This information is not intended to replace advice given to you by your health care provider. Make sure you discuss any questions you have with your health care provider. Document Released: 03/28/2008 Document Revised: 03/17/2016 Document Reviewed: 08/31/2015  2017 Elsevier

## 2016-11-02 LAB — URINE CULTURE: ORGANISM ID, BACTERIA: NO GROWTH

## 2016-12-22 ENCOUNTER — Ambulatory Visit (INDEPENDENT_AMBULATORY_CARE_PROVIDER_SITE_OTHER): Payer: BLUE CROSS/BLUE SHIELD

## 2016-12-22 ENCOUNTER — Ambulatory Visit (INDEPENDENT_AMBULATORY_CARE_PROVIDER_SITE_OTHER): Payer: BLUE CROSS/BLUE SHIELD | Admitting: Physician Assistant

## 2016-12-22 DIAGNOSIS — M545 Low back pain, unspecified: Secondary | ICD-10-CM

## 2016-12-22 MED ORDER — CYCLOBENZAPRINE HCL 10 MG PO TABS: 10.0000 mg | ORAL_TABLET | Freq: Three times a day (TID) | ORAL | 0 refills | Status: DC | PRN

## 2016-12-22 MED ORDER — MELOXICAM 7.5 MG PO TABS: 7.5000 mg | ORAL_TABLET | Freq: Every day | ORAL | 2 refills | Status: DC
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
## 2016-12-22 NOTE — Patient Instructions (Addendum)
Your x-ray is negative for fracture. You have acute muscle strain.  Apply heat to your back for 20-30 min 2-3 times a day. Please see the below stretches. Start walking 20-45 minutes most days of the week - exercise is very important for back pain relief.  If you are not better in 4-6 weeks, come back.  Mobic is an antiinflammatory. Your prescription is for 7.81m. If this is not helping, you may take 128mday - you may take two at the same time, or space them out throughout the day if you'd like. Do not take ibuprofen or other NSAIDs with this medication.  Flexeril is a muscle relaxer.  Please take this as directed for muscle spasm. This will not interact with Mobic.   Back Exercises The following exercises strengthen the muscles that help to support the back. They also help to keep the lower back flexible. Doing these exercises can help to prevent back pain or lessen existing pain. If you have back pain or discomfort, try doing these exercises 2-3 times each day or as told by your health care provider. When the pain goes away, do them once each day, but increase the number of times that you repeat the steps for each exercise (do more repetitions). If you do not have back pain or discomfort, do these exercises once each day or as told by your health care provider. Exercises Single Knee to Chest   Repeat these steps 3-5 times for each leg: 1. Lie on your back on a firm bed or the floor with your legs extended. 2. Bring one knee to your chest. Your other leg should stay extended and in contact with the floor. 3. Hold your knee in place by grabbing your knee or thigh. 4. Pull on your knee until you feel a gentle stretch in your lower back. 5. Hold the stretch for 10-30 seconds. 6. Slowly release and straighten your leg. Pelvic Tilt   Repeat these steps 5-10 times: 1. Lie on your back on a firm bed or the floor with your legs extended. 2. Bend your knees so they are pointing toward the ceiling  and your feet are flat on the floor. 3. Tighten your lower abdominal muscles to press your lower back against the floor. This motion will tilt your pelvis so your tailbone points up toward the ceiling instead of pointing to your feet or the floor. 4. With gentle tension and even breathing, hold this position for 5-10 seconds. Cat-Cow   Repeat these steps until your lower back becomes more flexible: 1. Get into a hands-and-knees position on a firm surface. Keep your hands under your shoulders, and keep your knees under your hips. You may place padding under your knees for comfort. 2. Let your head hang down, and point your tailbone toward the floor so your lower back becomes rounded like the back of a cat. 3. Hold this position for 5 seconds. 4. Slowly lift your head and point your tailbone up toward the ceiling so your back forms a sagging arch like the back of a cow. 5. Hold this position for 5 seconds. Press-Ups   Repeat these steps 5-10 times: 1. Lie on your abdomen (face-down) on the floor. 2. Place your palms near your head, about shoulder-width apart. 3. While you keep your back as relaxed as possible and keep your hips on the floor, slowly straighten your arms to raise the top half of your body and lift your shoulders. Do not use your back muscles  to raise your upper torso. You may adjust the placement of your hands to make yourself more comfortable. 4. Hold this position for 5 seconds while you keep your back relaxed. 5. Slowly return to lying flat on the floor. Bridges   Repeat these steps 10 times: 1. Lie on your back on a firm surface. 2. Bend your knees so they are pointing toward the ceiling and your feet are flat on the floor. 3. Tighten your buttocks muscles and lift your buttocks off of the floor until your waist is at almost the same height as your knees. You should feel the muscles working in your buttocks and the back of your thighs. If you do not feel these muscles, slide  your feet 1-2 inches farther away from your buttocks. 4. Hold this position for 3-5 seconds. 5. Slowly lower your hips to the starting position, and allow your buttocks muscles to relax completely. If this exercise is too easy, try doing it with your arms crossed over your chest. Abdominal Crunches   Repeat these steps 5-10 times: 1. Lie on your back on a firm bed or the floor with your legs extended. 2. Bend your knees so they are pointing toward the ceiling and your feet are flat on the floor. 3. Cross your arms over your chest. 4. Tip your chin slightly toward your chest without bending your neck. 5. Tighten your abdominal muscles and slowly raise your trunk (torso) high enough to lift your shoulder blades a tiny bit off of the floor. Avoid raising your torso higher than that, because it can put too much stress on your low back and it does not help to strengthen your abdominal muscles. 6. Slowly return to your starting position. Back Lifts  Repeat these steps 5-10 times: 1. Lie on your abdomen (face-down) with your arms at your sides, and rest your forehead on the floor. 2. Tighten the muscles in your legs and your buttocks. 3. Slowly lift your chest off of the floor while you keep your hips pressed to the floor. Keep the back of your head in line with the curve in your back. Your eyes should be looking at the floor. 4. Hold this position for 3-5 seconds. 5. Slowly return to your starting position. Contact a health care provider if:  Your back pain or discomfort gets much worse when you do an exercise.  Your back pain or discomfort does not lessen within 2 hours after you exercise. If you have any of these problems, stop doing these exercises right away. Do not do them again unless your health care provider says that you can. Get help right away if:  You develop sudden, severe back pain. If this happens, stop doing the exercises right away. Do not do them again unless your health care  provider says that you can. This information is not intended to replace advice given to you by your health care provider. Make sure you discuss any questions you have with your health care provider. Document Released: 11/17/2004 Document Revised: 02/17/2016 Document Reviewed: 12/04/2014 Elsevier Interactive Patient Education  2017 [employer] you for coming in today. I hope you feel we met your needs.  Feel free to call [facility] if you have any questions or further requests.  Please consider signing up for MyChart if you do not already have it, as this is a great way to communicate with me.  Best,  Whitney McVey, PA-C  IF you received an x-ray today, you will  receive an Pharmacologist from [facility]. Please contact [facility] at [phone] with questions or concerns regarding your invoice.   IF you received labwork today, you will receive an invoice from [place]. Please contact [facility] at [phone] with questions or concerns regarding your invoice.   Our billing staff will not be able to assist you with questions regarding bills from these companies.  You will be contacted with the lab results as soon as they are available. The fastest way to get your results is to activate your My Chart account. Instructions are located on the last page of this paperwork. If you have not heard from [place] regarding the results in 2 weeks, please contact this office.

## 2017-07-13 ENCOUNTER — Other Ambulatory Visit: Payer: Self-pay | Admitting: Obstetrics and Gynecology

## 2017-07-13 DIAGNOSIS — R921 Mammographic calcification found on diagnostic imaging of breast: Secondary | ICD-10-CM

## 2017-07-20 ENCOUNTER — Ambulatory Visit
Admission: RE | Admit: 2017-07-20 | Discharge: 2017-07-20 | Disposition: A | Discharge status: Home / Self Care | Payer: BLUE CROSS/BLUE SHIELD | Source: Ambulatory Visit | Attending: Obstetrics and Gynecology | Admitting: Obstetrics and Gynecology

## 2017-07-20 DIAGNOSIS — R921 Mammographic calcification found on diagnostic imaging of breast: Secondary | ICD-10-CM

## 2018-01-22 ENCOUNTER — Other Ambulatory Visit: Payer: Self-pay | Admitting: Physician Assistant

## 2018-01-22 ENCOUNTER — Ambulatory Visit
Admission: RE | Admit: 2018-01-22 | Discharge: 2018-01-22 | Disposition: A | Discharge status: Home / Self Care | Payer: Self-pay | Source: Ambulatory Visit | Attending: Physician Assistant | Admitting: Physician Assistant

## 2018-01-22 DIAGNOSIS — R7611 Nonspecific reaction to tuberculin skin test without active tuberculosis: Secondary | ICD-10-CM

## 2018-02-27 IMAGING — MG 2D DIGITAL DIAGNOSTIC BILATERAL MAMMOGRAM WITH CAD AND ADJUNCT T
8 of 16 series · 8 of 32 positions shown · non-contrast
Comparison: Previous exam(s).

CLINICAL DATA: Followup right breast probably benign
calcifications.

EXAM:
2D DIGITAL DIAGNOSTIC BILATERAL MAMMOGRAM WITH CAD AND ADJUNCT TOMO

[R CC (1 of 3)]
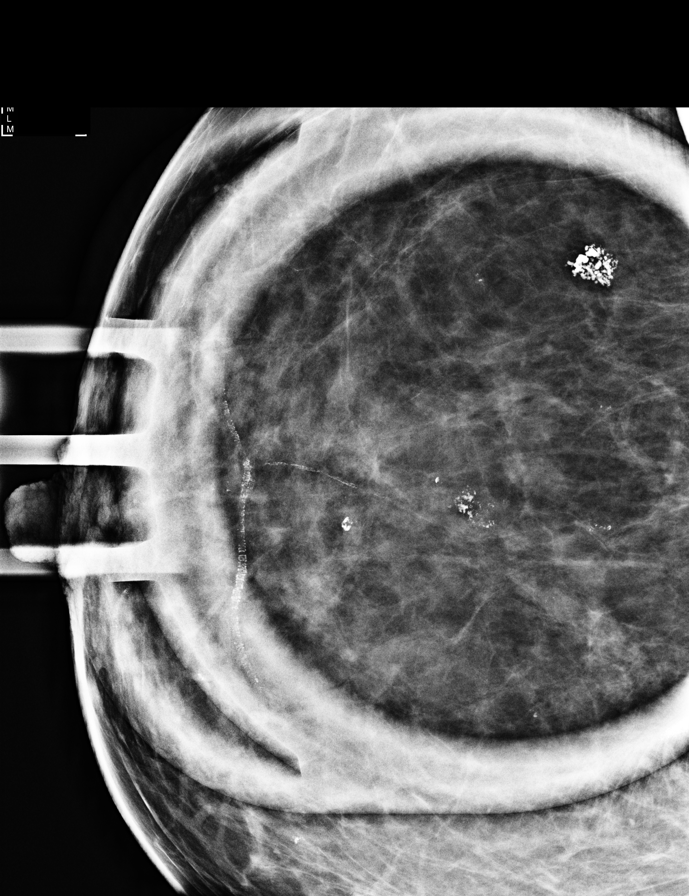

[R CC (2 of 3)]
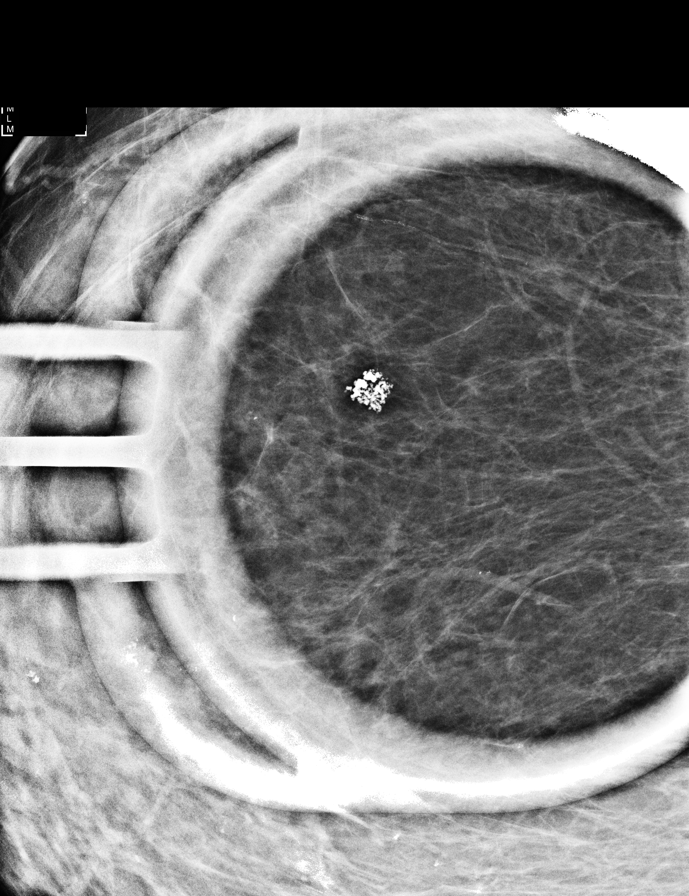

[R ML (1 of 2)]
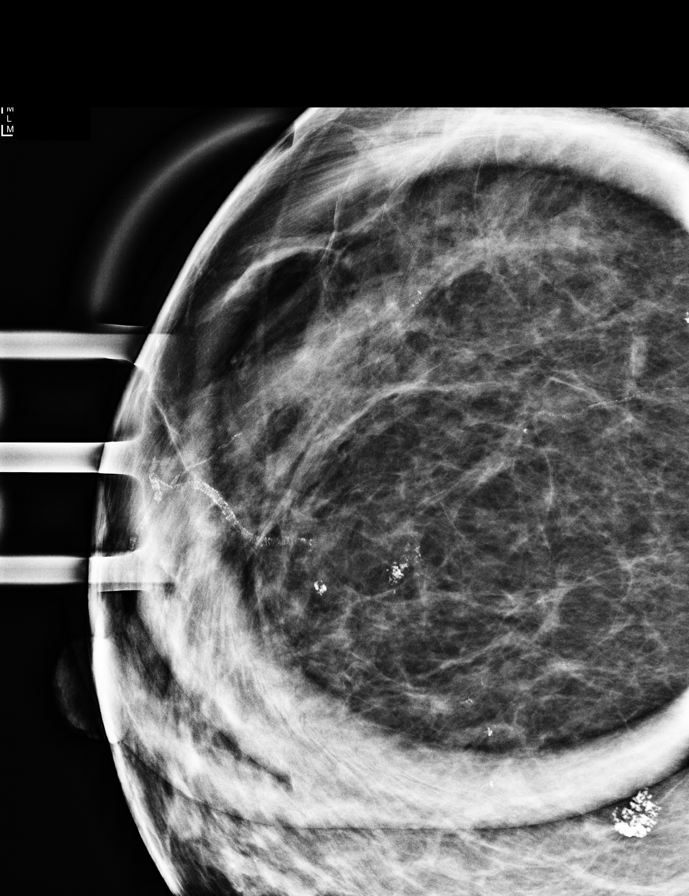

[R ML (2 of 2)]
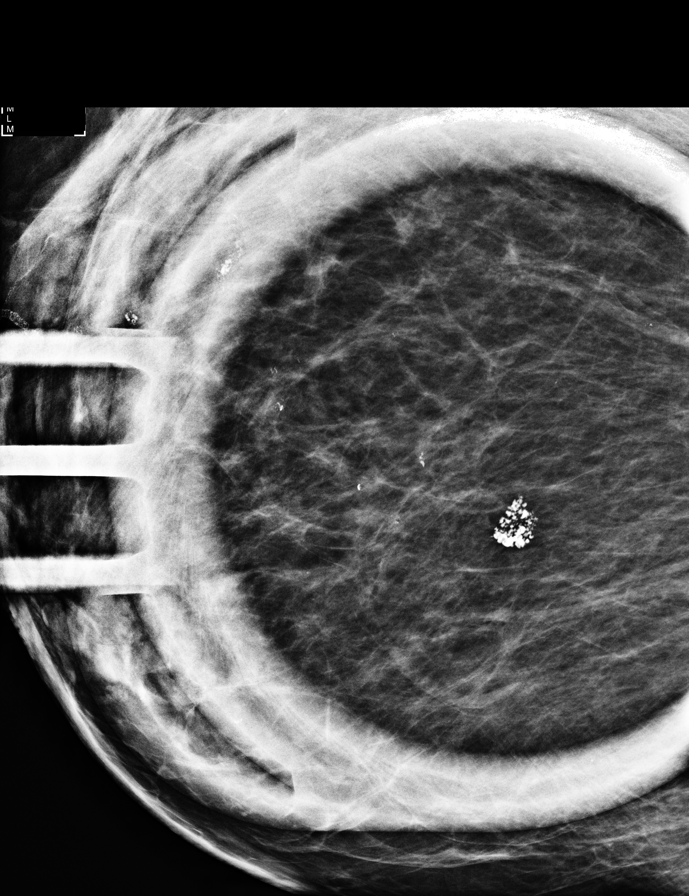

[L CC synth-2D]
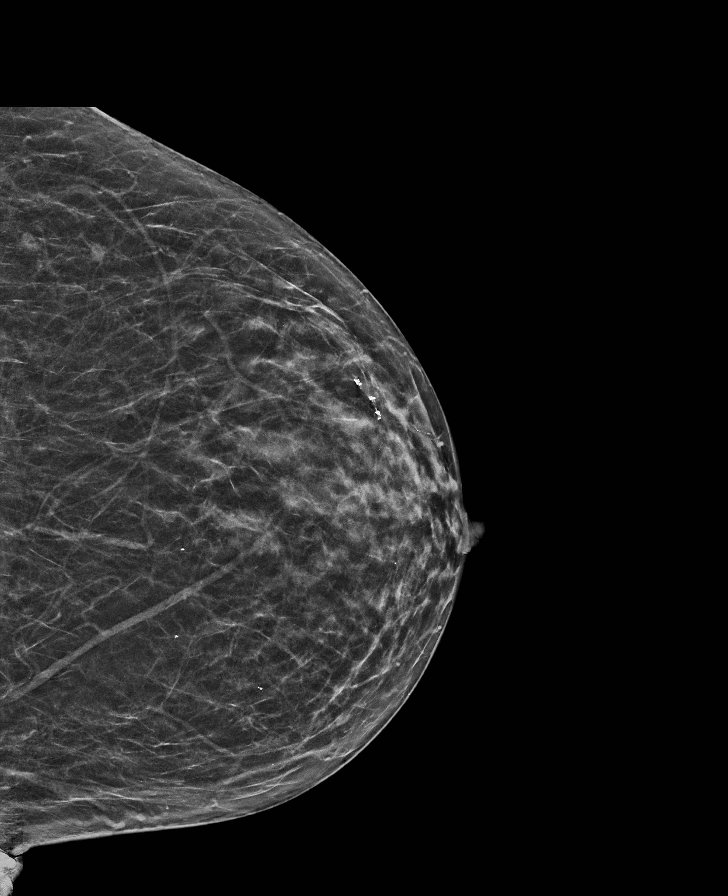

[R CC synth-2D]
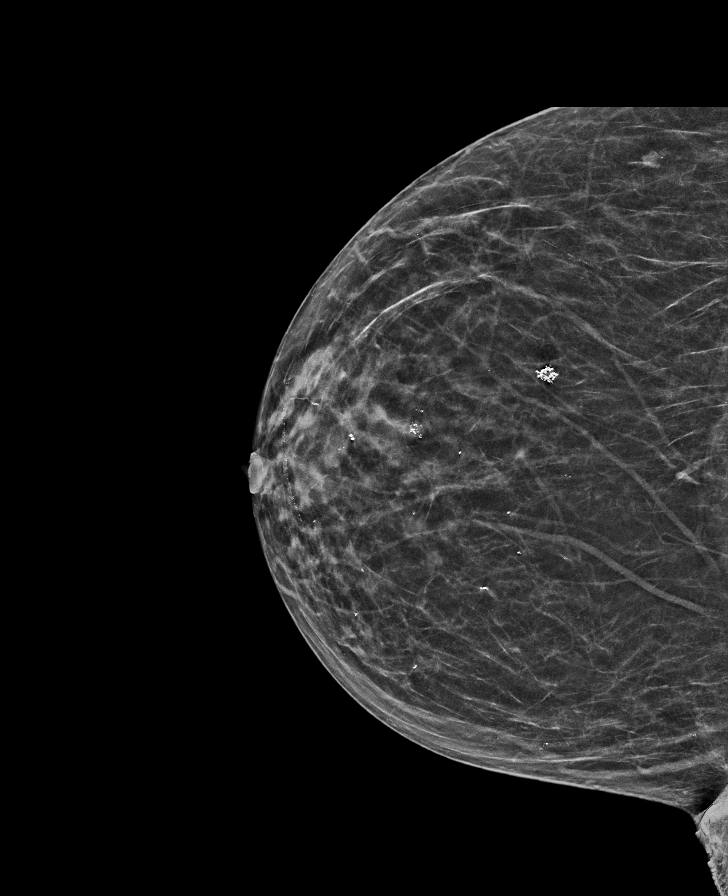

[L CC]
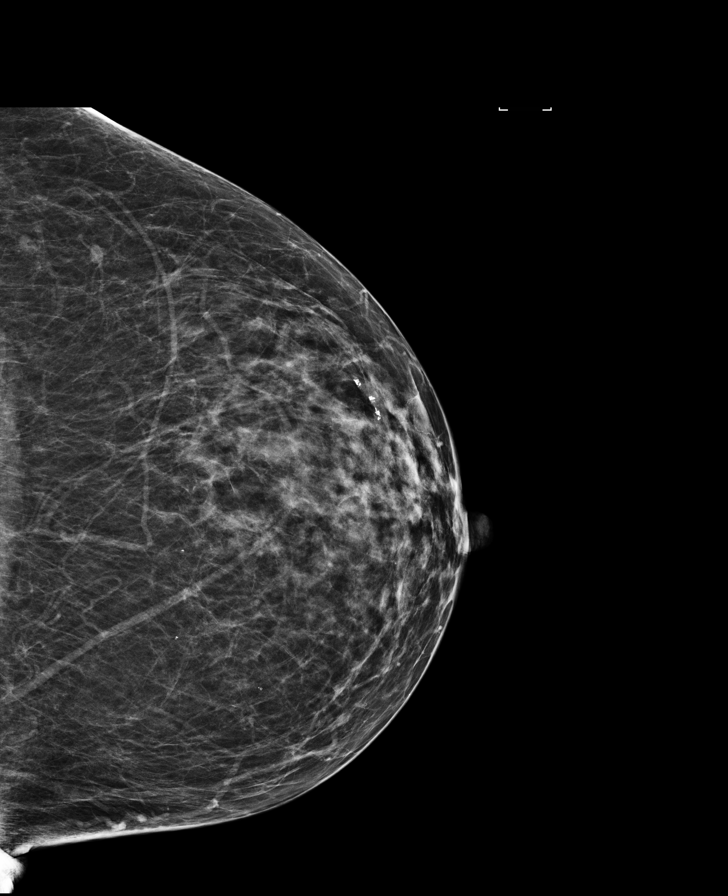

[R CC (3 of 3)]
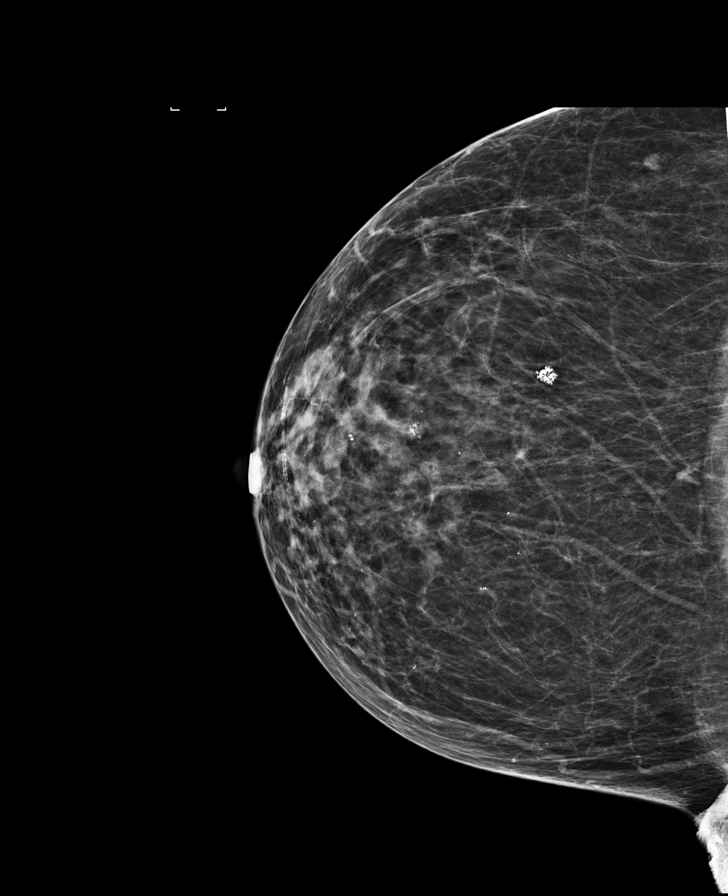

[8 of 32 positions shown; findings below may reference images not displayed]

ACR Breast Density Category b: There are scattered areas of
fibroglandular density.
FINDINGS: The previously described probably benign calcifications in upper
outer right breast have a slightly more coarse appearance than
initially seen on 07/14/2015 and 06/24/2015. No interval findings
suspicious for malignancy in either breast.

Mammographic images were processed with CAD.
IMPRESSION: Slightly improved appearance of benign calcifications in the
upper-outer right breast. No evidence of malignancy in either
breast.

RECOMMENDATION:
Bilateral screening mammogram in 1 year.

I have discussed the findings and recommendations with the patient.
Results were also provided in writing at the conclusion of the
visit. If applicable, a reminder letter will be sent to the patient
regarding the next appointment.

BI-RADS CATEGORY  2: Benign.

## 2018-06-18 ENCOUNTER — Other Ambulatory Visit: Payer: Self-pay | Admitting: Obstetrics and Gynecology

## 2018-06-18 DIAGNOSIS — Z1231 Encounter for screening mammogram for malignant neoplasm of breast: Secondary | ICD-10-CM

## 2018-07-23 ENCOUNTER — Ambulatory Visit
Admission: RE | Admit: 2018-07-23 | Discharge: 2018-07-23 | Disposition: A | Discharge status: Home / Self Care | Payer: Managed Care, Other (non HMO) | Source: Ambulatory Visit | Attending: Obstetrics and Gynecology | Admitting: Obstetrics and Gynecology

## 2018-07-23 DIAGNOSIS — Z1231 Encounter for screening mammogram for malignant neoplasm of breast: Secondary | ICD-10-CM

## 2019-07-16 ENCOUNTER — Other Ambulatory Visit: Payer: Self-pay | Admitting: Internal Medicine

## 2019-07-16 DIAGNOSIS — Z1231 Encounter for screening mammogram for malignant neoplasm of breast: Secondary | ICD-10-CM

## 2019-07-25 ENCOUNTER — Other Ambulatory Visit: Payer: Self-pay

## 2019-07-25 ENCOUNTER — Ambulatory Visit
Admission: RE | Admit: 2019-07-25 | Discharge: 2019-07-25 | Disposition: A | Discharge status: Home / Self Care | Payer: Managed Care, Other (non HMO) | Source: Ambulatory Visit | Attending: Internal Medicine | Admitting: Internal Medicine

## 2019-07-25 DIAGNOSIS — Z1231 Encounter for screening mammogram for malignant neoplasm of breast: Secondary | ICD-10-CM

## 2019-12-10 DIAGNOSIS — L400 Psoriasis vulgaris: Secondary | ICD-10-CM | POA: Diagnosis not present

## 2019-12-10 DIAGNOSIS — Z79899 Other long term (current) drug therapy: Secondary | ICD-10-CM | POA: Diagnosis not present

## 2019-12-19 DIAGNOSIS — L400 Psoriasis vulgaris: Secondary | ICD-10-CM | POA: Diagnosis not present

## 2019-12-28 ENCOUNTER — Ambulatory Visit: Payer: Self-pay | Attending: Internal Medicine

## 2019-12-28 DIAGNOSIS — Z23 Encounter for immunization: Secondary | ICD-10-CM

## 2019-12-28 NOTE — Progress Notes (Signed)
   Covid-19 Vaccination Clinic  Name:  Kristi Avery    MRN: [id number] DOB: [birth year]  12/28/2019  [name] was observed post Covid-19 immunization for 15 minutes without incident. She was provided with Vaccine Information Sheet and instruction to access the V-Safe system.   [name] was instructed to call 911 with any severe reactions post vaccine: Marland Kitchen Difficulty breathing  . Swelling of face and throat  . A fast heartbeat  . A bad rash all over body  . Dizziness and weakness   Immunizations Administered    Name Date Dose VIS Date Route   Pfizer COVID-19 Vaccine 12/28/2019 10:21 AM 0.3 mL 10/04/2019 Intramuscular   Manufacturer: [place]   Lot: [id number]   [place]: [id number]

## 2020-01-18 ENCOUNTER — Ambulatory Visit: Payer: Self-pay | Attending: Internal Medicine

## 2020-01-18 DIAGNOSIS — Z23 Encounter for immunization: Secondary | ICD-10-CM

## 2020-01-18 NOTE — Progress Notes (Signed)
   Covid-19 Vaccination Clinic  Name:  Kristi Avery    MRN: [id number] DOB: Mar 19, 1956  01/18/2020  Ms. Berkowitz was observed post Covid-19 immunization for 15 minutes without incident. She was provided with Vaccine Information Sheet and instruction to access the V-Safe system.   Ms. Khosravi was instructed to call 911 with any severe reactions post vaccine: Marland Kitchen Difficulty breathing  . Swelling of face and throat  . A fast heartbeat  . A bad rash all over body  . Dizziness and weakness   Immunizations Administered    Name Date Dose VIS Date Route   Pfizer COVID-19 Vaccine 01/18/2020 11:15 AM 0.3 mL 10/04/2019 Intramuscular   Manufacturer: [employer]   Lot: [id number]   [place]: [id number]

## 2020-01-21 DIAGNOSIS — L400 Psoriasis vulgaris: Secondary | ICD-10-CM | POA: Diagnosis not present

## 2020-01-21 DIAGNOSIS — Z79899 Other long term (current) drug therapy: Secondary | ICD-10-CM | POA: Diagnosis not present

## 2020-01-23 DIAGNOSIS — L400 Psoriasis vulgaris: Secondary | ICD-10-CM | POA: Diagnosis not present

## 2020-03-24 DIAGNOSIS — Z79899 Other long term (current) drug therapy: Secondary | ICD-10-CM | POA: Diagnosis not present

## 2020-03-24 DIAGNOSIS — L400 Psoriasis vulgaris: Secondary | ICD-10-CM | POA: Diagnosis not present

## 2020-03-26 DIAGNOSIS — L400 Psoriasis vulgaris: Secondary | ICD-10-CM | POA: Diagnosis not present

## 2020-05-14 DIAGNOSIS — L400 Psoriasis vulgaris: Secondary | ICD-10-CM | POA: Diagnosis not present

## 2020-06-18 DIAGNOSIS — I129 Hypertensive chronic kidney disease with stage 1 through stage 4 chronic kidney disease, or unspecified chronic kidney disease: Secondary | ICD-10-CM | POA: Diagnosis not present

## 2020-06-18 DIAGNOSIS — E1129 Type 2 diabetes mellitus with other diabetic kidney complication: Secondary | ICD-10-CM | POA: Diagnosis not present

## 2020-08-13 DIAGNOSIS — L400 Psoriasis vulgaris: Secondary | ICD-10-CM | POA: Diagnosis not present

## 2020-08-21 DIAGNOSIS — Z1231 Encounter for screening mammogram for malignant neoplasm of breast: Secondary | ICD-10-CM | POA: Diagnosis not present

## 2020-08-21 DIAGNOSIS — Z01419 Encounter for gynecological examination (general) (routine) without abnormal findings: Secondary | ICD-10-CM | POA: Diagnosis not present

## 2020-08-21 DIAGNOSIS — Z6828 Body mass index (BMI) 28.0-28.9, adult: Secondary | ICD-10-CM | POA: Diagnosis not present

## 2020-08-21 DIAGNOSIS — Z124 Encounter for screening for malignant neoplasm of cervix: Secondary | ICD-10-CM | POA: Diagnosis not present

## 2020-08-21 DIAGNOSIS — Z1239 Encounter for other screening for malignant neoplasm of breast: Secondary | ICD-10-CM | POA: Diagnosis not present

## 2020-08-26 DIAGNOSIS — Z23 Encounter for immunization: Secondary | ICD-10-CM | POA: Diagnosis not present

## 2020-10-05 DIAGNOSIS — E785 Hyperlipidemia, unspecified: Secondary | ICD-10-CM | POA: Diagnosis not present

## 2020-10-05 DIAGNOSIS — E1129 Type 2 diabetes mellitus with other diabetic kidney complication: Secondary | ICD-10-CM | POA: Diagnosis not present

## 2020-10-05 DIAGNOSIS — Z0000 Encounter for general adult medical examination without abnormal findings: Secondary | ICD-10-CM | POA: Diagnosis not present

## 2020-10-05 DIAGNOSIS — E559 Vitamin D deficiency, unspecified: Secondary | ICD-10-CM | POA: Diagnosis not present

## 2020-10-05 DIAGNOSIS — E040 Nontoxic diffuse goiter: Secondary | ICD-10-CM | POA: Diagnosis not present

## 2020-10-12 DIAGNOSIS — Z0000 Encounter for general adult medical examination without abnormal findings: Secondary | ICD-10-CM | POA: Diagnosis not present

## 2020-10-12 DIAGNOSIS — R82998 Other abnormal findings in urine: Secondary | ICD-10-CM | POA: Diagnosis not present

## 2020-10-12 DIAGNOSIS — I10 Essential (primary) hypertension: Secondary | ICD-10-CM | POA: Diagnosis not present

## 2020-10-12 DIAGNOSIS — E1129 Type 2 diabetes mellitus with other diabetic kidney complication: Secondary | ICD-10-CM | POA: Diagnosis not present

## 2020-10-13 DIAGNOSIS — Z1212 Encounter for screening for malignant neoplasm of rectum: Secondary | ICD-10-CM | POA: Diagnosis not present

## 2021-02-15 DIAGNOSIS — I129 Hypertensive chronic kidney disease with stage 1 through stage 4 chronic kidney disease, or unspecified chronic kidney disease: Secondary | ICD-10-CM | POA: Diagnosis not present

## 2021-02-15 DIAGNOSIS — M545 Low back pain, unspecified: Secondary | ICD-10-CM | POA: Diagnosis not present

## 2021-02-15 DIAGNOSIS — E1129 Type 2 diabetes mellitus with other diabetic kidney complication: Secondary | ICD-10-CM | POA: Diagnosis not present

## 2021-02-25 DIAGNOSIS — M6289 Other specified disorders of muscle: Secondary | ICD-10-CM | POA: Diagnosis not present

## 2021-02-25 DIAGNOSIS — M62830 Muscle spasm of back: Secondary | ICD-10-CM | POA: Diagnosis not present

## 2021-02-25 DIAGNOSIS — M6281 Muscle weakness (generalized): Secondary | ICD-10-CM | POA: Diagnosis not present

## 2021-03-02 DIAGNOSIS — M6281 Muscle weakness (generalized): Secondary | ICD-10-CM | POA: Diagnosis not present

## 2021-03-02 DIAGNOSIS — M6289 Other specified disorders of muscle: Secondary | ICD-10-CM | POA: Diagnosis not present

## 2021-03-02 DIAGNOSIS — M62830 Muscle spasm of back: Secondary | ICD-10-CM | POA: Diagnosis not present

## 2021-03-10 DIAGNOSIS — M6289 Other specified disorders of muscle: Secondary | ICD-10-CM | POA: Diagnosis not present

## 2021-03-10 DIAGNOSIS — M62830 Muscle spasm of back: Secondary | ICD-10-CM | POA: Diagnosis not present

## 2021-03-10 DIAGNOSIS — M6281 Muscle weakness (generalized): Secondary | ICD-10-CM | POA: Diagnosis not present

## 2021-03-17 DIAGNOSIS — M6289 Other specified disorders of muscle: Secondary | ICD-10-CM | POA: Diagnosis not present

## 2021-03-17 DIAGNOSIS — M62830 Muscle spasm of back: Secondary | ICD-10-CM | POA: Diagnosis not present

## 2021-03-17 DIAGNOSIS — M6281 Muscle weakness (generalized): Secondary | ICD-10-CM | POA: Diagnosis not present

## 2021-03-24 DIAGNOSIS — M62830 Muscle spasm of back: Secondary | ICD-10-CM | POA: Diagnosis not present

## 2021-03-24 DIAGNOSIS — M6281 Muscle weakness (generalized): Secondary | ICD-10-CM | POA: Diagnosis not present

## 2021-03-24 DIAGNOSIS — M6289 Other specified disorders of muscle: Secondary | ICD-10-CM | POA: Diagnosis not present

## 2021-03-31 DIAGNOSIS — M6289 Other specified disorders of muscle: Secondary | ICD-10-CM | POA: Diagnosis not present

## 2021-03-31 DIAGNOSIS — M6281 Muscle weakness (generalized): Secondary | ICD-10-CM | POA: Diagnosis not present

## 2021-03-31 DIAGNOSIS — M62830 Muscle spasm of back: Secondary | ICD-10-CM | POA: Diagnosis not present

## 2021-04-07 DIAGNOSIS — M6281 Muscle weakness (generalized): Secondary | ICD-10-CM | POA: Diagnosis not present

## 2021-04-07 DIAGNOSIS — M62830 Muscle spasm of back: Secondary | ICD-10-CM | POA: Diagnosis not present

## 2021-04-07 DIAGNOSIS — M6289 Other specified disorders of muscle: Secondary | ICD-10-CM | POA: Diagnosis not present

## 2021-04-12 DIAGNOSIS — M6289 Other specified disorders of muscle: Secondary | ICD-10-CM | POA: Diagnosis not present

## 2021-04-12 DIAGNOSIS — M62830 Muscle spasm of back: Secondary | ICD-10-CM | POA: Diagnosis not present

## 2021-04-12 DIAGNOSIS — M6281 Muscle weakness (generalized): Secondary | ICD-10-CM | POA: Diagnosis not present

## 2021-04-21 DIAGNOSIS — M6281 Muscle weakness (generalized): Secondary | ICD-10-CM | POA: Diagnosis not present

## 2021-04-21 DIAGNOSIS — M6289 Other specified disorders of muscle: Secondary | ICD-10-CM | POA: Diagnosis not present

## 2021-04-21 DIAGNOSIS — M62830 Muscle spasm of back: Secondary | ICD-10-CM | POA: Diagnosis not present

## 2021-04-23 DIAGNOSIS — M6289 Other specified disorders of muscle: Secondary | ICD-10-CM | POA: Diagnosis not present

## 2021-04-23 DIAGNOSIS — M6281 Muscle weakness (generalized): Secondary | ICD-10-CM | POA: Diagnosis not present

## 2021-04-23 DIAGNOSIS — M62830 Muscle spasm of back: Secondary | ICD-10-CM | POA: Diagnosis not present

## 2021-05-03 DIAGNOSIS — Z1152 Encounter for screening for COVID-19: Secondary | ICD-10-CM | POA: Diagnosis not present

## 2021-05-04 DIAGNOSIS — M6289 Other specified disorders of muscle: Secondary | ICD-10-CM | POA: Diagnosis not present

## 2021-05-04 DIAGNOSIS — M6281 Muscle weakness (generalized): Secondary | ICD-10-CM | POA: Diagnosis not present

## 2021-05-04 DIAGNOSIS — M62830 Muscle spasm of back: Secondary | ICD-10-CM | POA: Diagnosis not present

## 2021-05-06 DIAGNOSIS — Z1152 Encounter for screening for COVID-19: Secondary | ICD-10-CM | POA: Diagnosis not present

## 2021-05-12 DIAGNOSIS — M6289 Other specified disorders of muscle: Secondary | ICD-10-CM | POA: Diagnosis not present

## 2021-05-12 DIAGNOSIS — M6281 Muscle weakness (generalized): Secondary | ICD-10-CM | POA: Diagnosis not present

## 2021-05-12 DIAGNOSIS — M62830 Muscle spasm of back: Secondary | ICD-10-CM | POA: Diagnosis not present

## 2021-05-14 DIAGNOSIS — Z1152 Encounter for screening for COVID-19: Secondary | ICD-10-CM | POA: Diagnosis not present

## 2021-05-20 DIAGNOSIS — Z1152 Encounter for screening for COVID-19: Secondary | ICD-10-CM | POA: Diagnosis not present

## 2021-05-28 DIAGNOSIS — Z1152 Encounter for screening for COVID-19: Secondary | ICD-10-CM | POA: Diagnosis not present

## 2021-05-31 DIAGNOSIS — M6289 Other specified disorders of muscle: Secondary | ICD-10-CM | POA: Diagnosis not present

## 2021-05-31 DIAGNOSIS — M6281 Muscle weakness (generalized): Secondary | ICD-10-CM | POA: Diagnosis not present

## 2021-05-31 DIAGNOSIS — M62830 Muscle spasm of back: Secondary | ICD-10-CM | POA: Diagnosis not present

## 2021-06-02 DIAGNOSIS — Z1152 Encounter for screening for COVID-19: Secondary | ICD-10-CM | POA: Diagnosis not present

## 2021-06-10 DIAGNOSIS — Z1152 Encounter for screening for COVID-19: Secondary | ICD-10-CM | POA: Diagnosis not present

## 2021-07-05 DIAGNOSIS — Z1152 Encounter for screening for COVID-19: Secondary | ICD-10-CM | POA: Diagnosis not present

## 2021-07-08 DIAGNOSIS — Z1152 Encounter for screening for COVID-19: Secondary | ICD-10-CM | POA: Diagnosis not present

## 2021-07-21 DIAGNOSIS — Z1152 Encounter for screening for COVID-19: Secondary | ICD-10-CM | POA: Diagnosis not present

## 2021-07-22 DIAGNOSIS — Z1152 Encounter for screening for COVID-19: Secondary | ICD-10-CM | POA: Diagnosis not present

## 2021-08-06 DIAGNOSIS — Z1152 Encounter for screening for COVID-19: Secondary | ICD-10-CM | POA: Diagnosis not present

## 2021-08-16 DIAGNOSIS — Z23 Encounter for immunization: Secondary | ICD-10-CM | POA: Diagnosis not present

## 2021-09-20 DIAGNOSIS — L400 Psoriasis vulgaris: Secondary | ICD-10-CM | POA: Diagnosis not present

## 2021-10-08 DIAGNOSIS — E040 Nontoxic diffuse goiter: Secondary | ICD-10-CM | POA: Diagnosis not present

## 2021-10-08 DIAGNOSIS — E1129 Type 2 diabetes mellitus with other diabetic kidney complication: Secondary | ICD-10-CM | POA: Diagnosis not present

## 2021-10-08 DIAGNOSIS — E559 Vitamin D deficiency, unspecified: Secondary | ICD-10-CM | POA: Diagnosis not present

## 2021-10-14 DIAGNOSIS — Z1339 Encounter for screening examination for other mental health and behavioral disorders: Secondary | ICD-10-CM | POA: Diagnosis not present

## 2021-10-14 DIAGNOSIS — Z0000 Encounter for general adult medical examination without abnormal findings: Secondary | ICD-10-CM | POA: Diagnosis not present

## 2021-10-14 DIAGNOSIS — Z1331 Encounter for screening for depression: Secondary | ICD-10-CM | POA: Diagnosis not present

## 2021-10-14 DIAGNOSIS — Z1212 Encounter for screening for malignant neoplasm of rectum: Secondary | ICD-10-CM | POA: Diagnosis not present

## 2021-10-14 DIAGNOSIS — E1129 Type 2 diabetes mellitus with other diabetic kidney complication: Secondary | ICD-10-CM | POA: Diagnosis not present

## 2021-10-14 DIAGNOSIS — R82998 Other abnormal findings in urine: Secondary | ICD-10-CM | POA: Diagnosis not present

## 2021-11-03 DIAGNOSIS — Z124 Encounter for screening for malignant neoplasm of cervix: Secondary | ICD-10-CM | POA: Diagnosis not present

## 2021-11-03 DIAGNOSIS — L918 Other hypertrophic disorders of the skin: Secondary | ICD-10-CM | POA: Diagnosis not present

## 2021-11-03 DIAGNOSIS — Z1231 Encounter for screening mammogram for malignant neoplasm of breast: Secondary | ICD-10-CM | POA: Diagnosis not present

## 2021-11-03 DIAGNOSIS — N952 Postmenopausal atrophic vaginitis: Secondary | ICD-10-CM | POA: Diagnosis not present

## 2021-11-03 DIAGNOSIS — Z01419 Encounter for gynecological examination (general) (routine) without abnormal findings: Secondary | ICD-10-CM | POA: Diagnosis not present

## 2021-11-30 DIAGNOSIS — M4180 Other forms of scoliosis, site unspecified: Secondary | ICD-10-CM | POA: Diagnosis not present

## 2021-11-30 DIAGNOSIS — M4310 Spondylolisthesis, site unspecified: Secondary | ICD-10-CM | POA: Diagnosis not present

## 2021-11-30 DIAGNOSIS — M5459 Other low back pain: Secondary | ICD-10-CM | POA: Diagnosis not present

## 2021-11-30 DIAGNOSIS — M5451 Vertebrogenic low back pain: Secondary | ICD-10-CM | POA: Diagnosis not present

## 2021-12-10 DIAGNOSIS — M5416 Radiculopathy, lumbar region: Secondary | ICD-10-CM | POA: Diagnosis not present

## 2021-12-14 DIAGNOSIS — M5451 Vertebrogenic low back pain: Secondary | ICD-10-CM | POA: Diagnosis not present

## 2021-12-15 DIAGNOSIS — M5416 Radiculopathy, lumbar region: Secondary | ICD-10-CM | POA: Insufficient documentation

## 2022-01-01 DIAGNOSIS — M5416 Radiculopathy, lumbar region: Secondary | ICD-10-CM | POA: Diagnosis not present

## 2022-04-12 DIAGNOSIS — E1129 Type 2 diabetes mellitus with other diabetic kidney complication: Secondary | ICD-10-CM | POA: Diagnosis not present

## 2022-04-12 DIAGNOSIS — N1831 Chronic kidney disease, stage 3a: Secondary | ICD-10-CM | POA: Diagnosis not present

## 2022-04-12 DIAGNOSIS — Z1152 Encounter for screening for COVID-19: Secondary | ICD-10-CM | POA: Diagnosis not present

## 2022-04-14 DIAGNOSIS — Z1152 Encounter for screening for COVID-19: Secondary | ICD-10-CM | POA: Diagnosis not present

## 2022-05-31 DIAGNOSIS — M5416 Radiculopathy, lumbar region: Secondary | ICD-10-CM | POA: Diagnosis not present

## 2022-06-16 DIAGNOSIS — Z1152 Encounter for screening for COVID-19: Secondary | ICD-10-CM | POA: Diagnosis not present

## 2022-06-23 DIAGNOSIS — Z5181 Encounter for therapeutic drug level monitoring: Secondary | ICD-10-CM | POA: Diagnosis not present

## 2022-06-23 DIAGNOSIS — L409 Psoriasis, unspecified: Secondary | ICD-10-CM | POA: Diagnosis not present

## 2022-07-11 DIAGNOSIS — R7612 Nonspecific reaction to cell mediated immunity measurement of gamma interferon antigen response without active tuberculosis: Secondary | ICD-10-CM | POA: Diagnosis not present

## 2022-09-02 DIAGNOSIS — Z23 Encounter for immunization: Secondary | ICD-10-CM | POA: Diagnosis not present

## 2022-10-18 DIAGNOSIS — E1129 Type 2 diabetes mellitus with other diabetic kidney complication: Secondary | ICD-10-CM | POA: Diagnosis not present

## 2022-10-18 DIAGNOSIS — E040 Nontoxic diffuse goiter: Secondary | ICD-10-CM | POA: Diagnosis not present

## 2022-10-18 DIAGNOSIS — E785 Hyperlipidemia, unspecified: Secondary | ICD-10-CM | POA: Diagnosis not present

## 2022-10-18 DIAGNOSIS — E559 Vitamin D deficiency, unspecified: Secondary | ICD-10-CM | POA: Diagnosis not present

## 2022-10-18 DIAGNOSIS — I10 Essential (primary) hypertension: Secondary | ICD-10-CM | POA: Diagnosis not present

## 2023-06-12 ENCOUNTER — Emergency Department (HOSPITAL_COMMUNITY): Payer: No Typology Code available for payment source

## 2023-06-12 ENCOUNTER — Encounter (HOSPITAL_COMMUNITY): Payer: Self-pay

## 2023-06-12 ENCOUNTER — Other Ambulatory Visit: Payer: Self-pay

## 2023-06-12 ENCOUNTER — Inpatient Hospital Stay (HOSPITAL_COMMUNITY)
Admission: EM | Admit: 2023-06-12 | Discharge: 2023-06-15 | DRG: 066 | Disposition: A | Discharge status: Home / Self Care | Payer: No Typology Code available for payment source | Attending: Internal Medicine | Admitting: Internal Medicine

## 2023-06-12 DIAGNOSIS — K219 Gastro-esophageal reflux disease without esophagitis: Secondary | ICD-10-CM | POA: Diagnosis present

## 2023-06-12 DIAGNOSIS — Z7983 Long term (current) use of bisphosphonates: Secondary | ICD-10-CM

## 2023-06-12 DIAGNOSIS — R42 Dizziness and giddiness: Secondary | ICD-10-CM | POA: Diagnosis not present

## 2023-06-12 DIAGNOSIS — L409 Psoriasis, unspecified: Secondary | ICD-10-CM | POA: Diagnosis present

## 2023-06-12 DIAGNOSIS — Z8711 Personal history of peptic ulcer disease: Secondary | ICD-10-CM

## 2023-06-12 DIAGNOSIS — R29702 NIHSS score 2: Secondary | ICD-10-CM | POA: Diagnosis present

## 2023-06-12 DIAGNOSIS — Z716 Tobacco abuse counseling: Secondary | ICD-10-CM

## 2023-06-12 DIAGNOSIS — Z7982 Long term (current) use of aspirin: Secondary | ICD-10-CM

## 2023-06-12 DIAGNOSIS — I6523 Occlusion and stenosis of bilateral carotid arteries: Secondary | ICD-10-CM | POA: Diagnosis present

## 2023-06-12 DIAGNOSIS — M47812 Spondylosis without myelopathy or radiculopathy, cervical region: Secondary | ICD-10-CM | POA: Diagnosis present

## 2023-06-12 DIAGNOSIS — Z8673 Personal history of transient ischemic attack (TIA), and cerebral infarction without residual deficits: Secondary | ICD-10-CM

## 2023-06-12 DIAGNOSIS — E1165 Type 2 diabetes mellitus with hyperglycemia: Secondary | ICD-10-CM | POA: Diagnosis present

## 2023-06-12 DIAGNOSIS — E1159 Type 2 diabetes mellitus with other circulatory complications: Secondary | ICD-10-CM

## 2023-06-12 DIAGNOSIS — Z833 Family history of diabetes mellitus: Secondary | ICD-10-CM

## 2023-06-12 DIAGNOSIS — I10 Essential (primary) hypertension: Secondary | ICD-10-CM | POA: Diagnosis present

## 2023-06-12 DIAGNOSIS — Z791 Long term (current) use of non-steroidal anti-inflammatories (NSAID): Secondary | ICD-10-CM

## 2023-06-12 DIAGNOSIS — R29810 Facial weakness: Secondary | ICD-10-CM | POA: Diagnosis present

## 2023-06-12 DIAGNOSIS — Z7984 Long term (current) use of oral hypoglycemic drugs: Secondary | ICD-10-CM

## 2023-06-12 DIAGNOSIS — Z8249 Family history of ischemic heart disease and other diseases of the circulatory system: Secondary | ICD-10-CM

## 2023-06-12 DIAGNOSIS — F17210 Nicotine dependence, cigarettes, uncomplicated: Secondary | ICD-10-CM | POA: Diagnosis present

## 2023-06-12 DIAGNOSIS — I639 Cerebral infarction, unspecified: Secondary | ICD-10-CM

## 2023-06-12 DIAGNOSIS — N1831 Chronic kidney disease, stage 3a: Secondary | ICD-10-CM | POA: Diagnosis present

## 2023-06-12 DIAGNOSIS — Z885 Allergy status to narcotic agent status: Secondary | ICD-10-CM

## 2023-06-12 DIAGNOSIS — E119 Type 2 diabetes mellitus without complications: Secondary | ICD-10-CM

## 2023-06-12 DIAGNOSIS — E785 Hyperlipidemia, unspecified: Secondary | ICD-10-CM | POA: Diagnosis present

## 2023-06-12 DIAGNOSIS — I129 Hypertensive chronic kidney disease with stage 1 through stage 4 chronic kidney disease, or unspecified chronic kidney disease: Secondary | ICD-10-CM | POA: Diagnosis present

## 2023-06-12 DIAGNOSIS — Z823 Family history of stroke: Secondary | ICD-10-CM

## 2023-06-12 DIAGNOSIS — Z79899 Other long term (current) drug therapy: Secondary | ICD-10-CM

## 2023-06-12 DIAGNOSIS — R471 Dysarthria and anarthria: Secondary | ICD-10-CM | POA: Diagnosis present

## 2023-06-12 DIAGNOSIS — Z882 Allergy status to sulfonamides status: Secondary | ICD-10-CM

## 2023-06-12 DIAGNOSIS — G4700 Insomnia, unspecified: Secondary | ICD-10-CM | POA: Diagnosis present

## 2023-06-12 DIAGNOSIS — I6329 Cerebral infarction due to unspecified occlusion or stenosis of other precerebral arteries: Principal | ICD-10-CM | POA: Diagnosis present

## 2023-06-12 DIAGNOSIS — E1122 Type 2 diabetes mellitus with diabetic chronic kidney disease: Secondary | ICD-10-CM | POA: Diagnosis present

## 2023-06-12 DIAGNOSIS — M810 Age-related osteoporosis without current pathological fracture: Secondary | ICD-10-CM | POA: Diagnosis present

## 2023-06-12 DIAGNOSIS — D631 Anemia in chronic kidney disease: Secondary | ICD-10-CM | POA: Diagnosis present

## 2023-06-12 LAB — RAPID URINE DRUG SCREEN, HOSP PERFORMED
Amphetamines: NOT DETECTED
Barbiturates: NOT DETECTED
Benzodiazepines: NOT DETECTED
Cocaine: NOT DETECTED
Opiates: NOT DETECTED
Tetrahydrocannabinol: POSITIVE — AB

## 2023-06-12 LAB — CBC
HCT: 35.9 % — ABNORMAL LOW (ref 36.0–46.0)
Hemoglobin: 12.5 g/dL (ref 12.0–15.0)
MCH: 30.3 pg (ref 26.0–34.0)
MCHC: 34.8 g/dL (ref 30.0–36.0)
MCV: 87.1 fL (ref 80.0–100.0)
Platelets: 452 10*3/uL — ABNORMAL HIGH (ref 150–400)
RBC: 4.12 MIL/uL (ref 3.87–5.11)
RDW: 13.1 % (ref 11.5–15.5)
WBC: 5.4 10*3/uL (ref 4.0–10.5)
nRBC: 0 % (ref 0.0–0.2)

## 2023-06-12 LAB — BASIC METABOLIC PANEL
Anion gap: 8 (ref 5–15)
BUN: 23 mg/dL (ref 8–23)
CO2: 21 mmol/L — ABNORMAL LOW (ref 22–32)
Calcium: 8.9 mg/dL (ref 8.9–10.3)
Chloride: 105 mmol/L (ref 98–111)
Creatinine, Ser: 1.32 mg/dL — ABNORMAL HIGH (ref 0.44–1.00)
GFR, Estimated: 45 mL/min — ABNORMAL LOW (ref >60)
Glucose, Bld: 318 mg/dL — ABNORMAL HIGH (ref 70–99)
Potassium: 4.5 mmol/L (ref 3.5–5.1)
Sodium: 134 mmol/L — ABNORMAL LOW (ref 135–145)

## 2023-06-12 LAB — URINALYSIS, ROUTINE W REFLEX MICROSCOPIC
Bacteria, UA: NONE SEEN
Bilirubin Urine: NEGATIVE
Glucose, UA: 150 mg/dL — AB
Hgb urine dipstick: NEGATIVE
Ketones, ur: NEGATIVE mg/dL
Leukocytes,Ua: NEGATIVE
Nitrite: NEGATIVE
Protein, ur: 30 mg/dL — AB
Specific Gravity, Urine: 1.006 (ref 1.005–1.030)
pH: 6 (ref 5.0–8.0)

## 2023-06-12 LAB — CBG MONITORING, ED
Glucose-Capillary: 295 mg/dL — ABNORMAL HIGH (ref 70–99)
Glucose-Capillary: 137 mg/dL — ABNORMAL HIGH (ref 70–99)

## 2023-06-12 MED ORDER — IOHEXOL 350 MG/ML SOLN
75.0000 mL | Freq: Once | INTRAVENOUS | Status: AC | PRN
  Administered 2023-06-12: 75 mL via INTRAVENOUS

## 2023-06-12 MED ORDER — ACETAMINOPHEN 325 MG PO TABS: 650.0000 mg | ORAL_TABLET | ORAL | Status: DC | PRN

## 2023-06-12 MED ORDER — STROKE: EARLY STAGES OF RECOVERY BOOK
Freq: Once | Status: AC
  Filled 2023-06-12 (×2): qty 1

## 2023-06-12 MED ORDER — INSULIN ASPART 100 UNIT/ML IJ SOLN
0.0000 [IU] | Freq: Three times a day (TID) | INTRAMUSCULAR | Status: DC
  Administered 2023-06-13: 5 [IU] via SUBCUTANEOUS
  Administered 2023-06-14 (×2): 2 [IU] via SUBCUTANEOUS
  Administered 2023-06-14: 5 [IU] via SUBCUTANEOUS
  Administered 2023-06-15: 2 [IU] via SUBCUTANEOUS
  Filled 2023-06-12: qty 0.15

## 2023-06-12 MED ORDER — ASPIRIN 81 MG PO CHEW
324.0000 mg | CHEWABLE_TABLET | Freq: Once | ORAL | Status: AC
  Administered 2023-06-12: 324 mg via ORAL
  Filled 2023-06-12: qty 4

## 2023-06-12 MED ORDER — INSULIN ASPART 100 UNIT/ML IJ SOLN
0.0000 [IU] | Freq: Every day | INTRAMUSCULAR | Status: DC
  Filled 2023-06-12: qty 0.05

## 2023-06-12 MED ORDER — SIMVASTATIN 20 MG PO TABS
40.0000 mg | ORAL_TABLET | Freq: Every day | ORAL | Status: DC
  Administered 2023-06-13: 40 mg via ORAL
  Filled 2023-06-12: qty 2

## 2023-06-12 MED ORDER — CLOPIDOGREL BISULFATE 75 MG PO TABS
75.0000 mg | ORAL_TABLET | Freq: Every day | ORAL | Status: DC
  Administered 2023-06-13 – 2023-06-15 (×3): 75 mg via ORAL
  Filled 2023-06-12 (×4): qty 1

## 2023-06-12 MED ORDER — ACETAMINOPHEN 650 MG RE SUPP: 650.0000 mg | RECTAL | Status: DC | PRN

## 2023-06-12 MED ORDER — ENOXAPARIN SODIUM 40 MG/0.4ML IJ SOSY
40.0000 mg | PREFILLED_SYRINGE | INTRAMUSCULAR | Status: DC
  Administered 2023-06-13 – 2023-06-15 (×3): 40 mg via SUBCUTANEOUS
  Filled 2023-06-12 (×3): qty 0.4

## 2023-06-12 MED ORDER — ACETAMINOPHEN 160 MG/5ML PO SOLN: 650.0000 mg | ORAL | Status: DC | PRN

## 2023-06-12 MED ORDER — CYCLOBENZAPRINE HCL 10 MG PO TABS: 5.0000 mg | ORAL_TABLET | Freq: Three times a day (TID) | ORAL | Status: DC | PRN

## 2023-06-12 MED ORDER — OMEPRAZOLE MAGNESIUM 20 MG PO TBEC: 20.0000 mg | DELAYED_RELEASE_TABLET | Freq: Every day | ORAL | Status: DC

## 2023-06-12 MED ORDER — ASPIRIN 81 MG PO TBEC
81.0000 mg | DELAYED_RELEASE_TABLET | Freq: Every day | ORAL | Status: DC
  Administered 2023-06-13 – 2023-06-15 (×3): 81 mg via ORAL
  Filled 2023-06-12 (×3): qty 1

## 2023-06-12 MED ORDER — PANTOPRAZOLE SODIUM 40 MG PO TBEC
40.0000 mg | DELAYED_RELEASE_TABLET | Freq: Every day | ORAL | Status: DC
  Administered 2023-06-13 – 2023-06-15 (×3): 40 mg via ORAL
  Filled 2023-06-12 (×3): qty 1

## 2023-06-12 MED ORDER — MECLIZINE HCL 25 MG PO TABS
25.0000 mg | ORAL_TABLET | Freq: Once | ORAL | Status: AC
  Administered 2023-06-12: 25 mg via ORAL
  Filled 2023-06-12: qty 1

## 2023-06-12 MED ORDER — SODIUM CHLORIDE 0.9 % IV BOLUS
1000.0000 mL | Freq: Once | INTRAVENOUS | Status: AC
  Administered 2023-06-12: 1000 mL via INTRAVENOUS

## 2023-06-12 NOTE — Assessment & Plan Note (Signed)
Hold metformin Mod scale SSI AC/HS 

## 2023-06-12 NOTE — ED Provider Notes (Signed)
Care of patient received from prior provider at 11:56 PM, please see their note for complete H/P and care plan.  Received handoff per ED course.  Clinical Course as of 06/12/23 2356  Mon Jun 12, 2023  1540 Stable HO from [facility] Vertiginous dizziness pending MR. [CC]    Clinical Course User Index [CC] Glyn Ade, MD    Reassessment: MRI demonstrated CVA.  Consulted neurology and consult hospitalist to arrange for admission.  CRITICAL CARE Performed by: Glyn Ade   Total critical care time: 30 minutes  Critical care time was exclusive of separately billable procedures and treating other patients.  Critical care was necessary to treat or prevent imminent or life-threatening deterioration.  Critical care was time spent personally by me on the following activities: development of treatment plan with patient and/or surrogate as well as nursing, discussions with consultants, evaluation of patient's response to treatment, examination of patient, obtaining history from patient or surrogate, ordering and performing treatments and interventions, ordering and review of laboratory studies, ordering and review of radiographic studies, pulse oximetry and re-evaluation of patient's condition.    Disposition:   Based on the above findings, I believe this patient is stable for admission.    Patient/family educated about specific findings on our evaluation and explained exact reasons for admission.  Patient/family educated about clinical situation and time was allowed to answer questions.   Admission team communicated with and agreed with need for admission. Patient admitted. Patient  ready to move at this time.     Emergency Department Medication Summary:   Medications  insulin aspart (novoLOG) injection 0-15 Units (has no administration in time range)  insulin aspart (novoLOG) injection 0-5 Units ( Subcutaneous Not Given 06/12/23 2231)   stroke: early stages of recovery book (has no  administration in time range)  acetaminophen (TYLENOL) tablet 650 mg (has no administration in time range)    Or  acetaminophen (TYLENOL) 160 MG/5ML solution 650 mg (has no administration in time range)    Or  acetaminophen (TYLENOL) suppository 650 mg (has no administration in time range)  enoxaparin (LOVENOX) injection 40 mg (has no administration in time range)  aspirin EC tablet 81 mg (has no administration in time range)  clopidogrel (PLAVIX) tablet 75 mg (has no administration in time range)  simvastatin (ZOCOR) tablet 40 mg (has no administration in time range)  cyclobenzaprine (FLEXERIL) tablet 5 mg (has no administration in time range)  pantoprazole (PROTONIX) EC tablet 40 mg (has no administration in time range)  meclizine (ANTIVERT) tablet 25 mg (25 mg Oral Given 06/12/23 1425)  sodium chloride 0.9 % bolus 1,000 mL (0 mLs Intravenous Stopped 06/12/23 1611)  aspirin chewable tablet 324 mg (324 mg Oral Given 06/12/23 2253)  iohexol (OMNIPAQUE) 350 MG/ML injection 75 mL (75 mLs Intravenous Contrast Given 06/12/23 2031)           Glyn Ade, MD 06/12/23 2357

## 2023-06-12 NOTE — Assessment & Plan Note (Signed)
Hold home BP meds and allow permissive HTN in setting of acute stroke. 

## 2023-06-12 NOTE — ED Triage Notes (Signed)
Patient began feeling dizzy along with slurred speech yesterday at 12pm. Has a headache. No facial droop presented. No change in sensation in either arm, leg, face. No changes in vision.

## 2023-06-12 NOTE — H&P (Signed)
History and Physical    Patient: Kristi Avery YQM:[id number] DOB: 1956-09-11 DOA: 06/12/2023 DOS: the patient was seen and examined on 06/12/2023 PCP: [name], MD  Patient coming from: Home  Chief Complaint:  Chief Complaint  Patient presents with   Dizziness   HPI: Kristi Avery is a 67 y.o. female with medical history significant of HTN, DM2, HLD.  Pt had onset of dizziness and slurred speech yesterday at 12pm.  Dizziness was intermittent.  Slurred speech persistent through today.  Dizziness worse with standing and with movement.  No weakness in arms or legs, no visual disturbance.  Found to have acute stroke on MRI in ED.  Review of Systems: As mentioned in the history of present illness. All other systems reviewed and are negative. Past Medical History:  Diagnosis Date   Abnormal Pap smear 1983   Anemia    Arthritis    Diabetes mellitus without complication (HCC)    Gastric ulcer with hemorrhage 09/01/2012   GERD (gastroesophageal reflux disease)    HLD (hyperlipidemia)    Hypertension    Insomnia    Menorrhagia    Hx   Psoriasis    Ulcer    Past Surgical History:  Procedure Laterality Date   COLONOSCOPY  2007   ESOPHAGOGASTRODUODENOSCOPY  09/01/2012   Procedure: ESOPHAGOGASTRODUODENOSCOPY (EGD);  Surgeon: Louis Meckel, MD;  Location: [facility];  Service: Endoscopy;  Laterality: N/A;   KIDNEY SURGERY     left   TUBAL LIGATION     Social History:  reports that she has been smoking cigarettes. She has a 17.5 pack-year smoking history. She has never used smokeless tobacco. She reports current alcohol use of about 2.0 - 3.0 standard drinks of alcohol per week. She reports that she does not use drugs.  Allergies  Allergen Reactions   Tramadol Nausea And Vomiting and Other (See Comments)    GI Intolerance   Sulfonamide Derivatives Nausea And Vomiting    Family History  Problem Relation Age of Onset   Diabetes Mother    Heart disease Mother    Stroke  Father    Diabetes Sister    Diabetes Brother    Colon cancer Neg Hx    Esophageal cancer Neg Hx    Rectal cancer Neg Hx    Stomach cancer Neg Hx    Breast cancer Neg Hx     Prior to Admission medications   Medication Sig Start Date End Date Taking? Authorizing Provider  ADVIL 200 MG CAPS Take 200 mg by mouth 2 (two) times daily as needed (for pain or headaches).   Yes [provider]  alendronate (FOSAMAX) 70 MG tablet Take 70 mg by mouth every Sunday. Take with a full glass of water on an empty stomach.   Yes [provider]  Cholecalciferol (VITAMIN D3 PO) Take 1 capsule by mouth daily.   Yes [provider]  Cyanocobalamin (VITAMIN B12 PO) Take 1 tablet by mouth daily.   Yes [provider]  cyclobenzaprine (FLEXERIL) 5 MG tablet Take 5 mg by mouth 3 (three) times daily as needed for muscle spasms.   Yes [provider]  irbesartan (AVAPRO) 300 MG tablet Take 300 mg by mouth daily.   Yes [provider]  metFORMIN (GLUCOPHAGE) 1000 MG tablet Take 500 mg by mouth daily with breakfast.   Yes [provider]  omeprazole (PRILOSEC OTC) 20 MG tablet Take 20 mg by mouth daily before breakfast.   Yes [provider]  simvastatin (ZOCOR) 40 MG tablet Take 40 mg by mouth daily.   Yes [provider]  TALTZ 80 MG/ML SOAJ Inject 80 mg into the skin every 28 (twenty-eight) days.   Yes [provider]    Physical Exam: Vitals:   06/12/23 1630 06/12/23 1645 06/12/23 1700 06/12/23 1952  BP: (!) 166/72 (!) 165/73  (!) 182/66  Pulse: 60 62  66  Resp: 17 17  16   Temp:   97.8 F (36.6 C) 98.4 F (36.9 C)  TempSrc:   Oral Oral  SpO2: 99% 97%  100%  Weight:      Height:       Constitutional: NAD, calm, comfortable Respiratory: clear to auscultation bilaterally, no wheezing, no crackles. Normal respiratory effort. No accessory muscle use.  Cardiovascular: Regular rate and rhythm, no murmurs / rubs /  gallops. No extremity edema. 2+ pedal pulses. No carotid bruits.  Abdomen: no tenderness, no masses palpated. No hepatosplenomegaly. Bowel sounds positive.  Neurologic: Maybe mild R sided facial droop, speech clear without evidence of aphasia or dysarthria Psychiatric: Normal judgment and insight. Alert and oriented x 3. Normal mood.   Data Reviewed:    Labs on Admission: I have personally reviewed following labs and imaging studies  CBC: Recent Labs  Lab 06/12/23 1327  WBC 5.4  HGB 12.5  HCT 35.9*  MCV 87.1  PLT 452*   Basic Metabolic Panel: Recent Labs  Lab 06/12/23 1327  NA 134*  K 4.5  CL 105  CO2 21*  GLUCOSE 318*  BUN 23  CREATININE 1.32*  CALCIUM 8.9   GFR: Estimated Creatinine Clearance: 35.8 mL/min (A) (by C-G formula based on SCr of 1.32 mg/dL (H)). Liver Function Tests: No results for input(s): "AST", "ALT", "ALKPHOS", "BILITOT", "PROT", "ALBUMIN" in the last 168 hours. No results for input(s): "LIPASE", "AMYLASE" in the last 168 hours. No results for input(s): "AMMONIA" in the last 168 hours. Coagulation Profile: No results for input(s): "INR", "PROTIME" in the last 168 hours. Cardiac Enzymes: No results for input(s): "CKTOTAL", "CKMB", "CKMBINDEX", "TROPONINI" in the last 168 hours. BNP (last 3 results) No results for input(s): "PROBNP" in the last 8760 hours. HbA1C: No results for input(s): "HGBA1C" in the last 72 hours. CBG: Recent Labs  Lab 06/12/23 1306  GLUCAP 295*   Lipid Profile: No results for input(s): "CHOL", "HDL", "LDLCALC", "TRIG", "CHOLHDL", "LDLDIRECT" in the last 72 hours. Thyroid Function Tests: No results for input(s): "TSH", "T4TOTAL", "FREET4", "T3FREE", "THYROIDAB" in the last 72 hours. Anemia Panel: No results for input(s): "VITAMINB12", "FOLATE", "FERRITIN", "TIBC", "IRON", "RETICCTPCT" in the last 72 hours. Urine analysis:    Component Value Date/Time   COLORURINE STRAW (A) 06/12/2023 1726   APPEARANCEUR CLEAR  06/12/2023 1726   LABSPEC 1.006 06/12/2023 1726   PHURINE 6.0 06/12/2023 1726   GLUCOSEU 150 (A) 06/12/2023 1726   HGBUR NEGATIVE 06/12/2023 1726   BILIRUBINUR NEGATIVE 06/12/2023 1726   BILIRUBINUR negative 10/31/2016 1031   KETONESUR NEGATIVE 06/12/2023 1726   PROTEINUR 30 (A) 06/12/2023 1726   UROBILINOGEN 0.2 10/31/2016 1031   UROBILINOGEN 0.2 09/01/2012 0142   NITRITE NEGATIVE 06/12/2023 1726   LEUKOCYTESUR NEGATIVE 06/12/2023 1726    Radiological Exams on Admission: MR BRAIN WO CONTRAST  Result Date: 06/12/2023 CLINICAL DATA:  Neuro deficit, acute, stroke suspected. Patient began feeling dizzy with slurred speech yesterday at 12 noon. Persistent headache. EXAM: MRI HEAD WITHOUT CONTRAST TECHNIQUE: Multiplanar, multiecho pulse sequences of the brain and surrounding structures were obtained without intravenous  contrast. COMPARISON:  CT head without contrast 06/12/2023 FINDINGS: Brain: Acute nonhemorrhagic left paramedian pontine infarct is present. Infarct measures up to 15 mm anterior to posterior and 6 mm cephalo caudad. Subtle T2 and FLAIR hyperintensity is associated. A more remote lateral pontine infarct corresponds to the finding at CT. No other acute infarct is present. Remote linear infarct is present in the lateral right thalamus. Deep brain nuclei are within normal limits. No significant white matter lesions are present. The ventricles are of normal size. No significant extraaxial fluid collection is present. The more inferior brainstem is within normal limits. Cerebellar hemispheres are normal bilaterally. Mm cephalo caudad. Vascular: Flow is present in the major intracranial arteries. Skull and upper cervical spine: The craniocervical junction is normal. Upper cervical spine is within normal limits. Chronic degenerative changes are present at C3-4 with a broad-based disc osteophyte complex resulting in central canal stenosis. Chronic loss of vertebral body height is present at both  C3 and C4. Craniocervical junction is normal. The sella is somewhat expanded. Pituitary is unremarkable. Marrow signal is otherwise normal. Sinuses/Orbits: The paranasal sinuses and mastoid air cells are clear. The globes and orbits are within normal limits. IMPRESSION: 1. Acute nonhemorrhagic left paramedian pontine infarct. 2. More remote lateral pontine infarct corresponds to the finding at CT. 3. Remote linear infarct of the lateral right thalamus. 4. Chronic degenerative changes of the cervical spine as described. Electronically Signed   By: Marin Roberts M.D.   On: 06/12/2023 19:56   CT Head Wo Contrast  Result Date: 06/12/2023 CLINICAL DATA:  Neuro deficit, acute, stroke suspected. EXAM: CT HEAD WITHOUT CONTRAST TECHNIQUE: Contiguous axial images were obtained from the base of the skull through the vertex without intravenous contrast. RADIATION DOSE REDUCTION: This exam was performed according to the departmental dose-optimization program which includes automated exposure control, adjustment of the mA and/or kV according to patient size and/or use of iterative reconstruction technique. COMPARISON:  None Available. FINDINGS: Brain: Age indeterminate small left pontine infarct. No evidence of acute hemorrhage, hydrocephalus, extra-axial collection or mass lesion/mass effect. Vascular: No hyperdense vessel identified. Skull: No acute fracture. Sinuses/Orbits: Clear sinuses.  No acute findings. Other: No mastoid effusions. IMPRESSION: Age indeterminate small left pontine infarct.  Recommend MRI. Electronically Signed   By: Feliberto Harts M.D.   On: 06/12/2023 16:21    EKG: Independently reviewed.   Assessment and Plan: * Acute ischemic stroke [facility]) Stroke pathway ASA 81 permanently + Plavix 75 for 21 days Tele monitor 2d echo CTA head and neck PT/OT/SLP Neuro consult  CKD stage 3a, GFR 45-59 ml/min (HCC) Creat 1.3 today which looks to be about her baseline. Presumably in setting of  HTN and DM2.  Hyperlipidemia Cont home Statin Check FLP  Hypertension Hold home BP meds and allow permissive HTN in setting of acute stroke.  Type II diabetes mellitus (HCC) Hold metformin Mod scale SSI AC/HS  Psoriasis Looks like pt takes biologic, Taltz, every 28 days for this.      Advance Care Planning:   Code Status: Full Code  Consults: Dr. Wilford Corner  Family Communication: Family at bedside  Severity of Illness: The appropriate patient status for this patient is OBSERVATION. Observation status is judged to be reasonable and necessary in order to provide the required intensity of service to ensure the patient's safety. The patient's presenting symptoms, physical exam findings, and initial radiographic and laboratory data in the context of their medical condition is felt to place them at decreased risk for further  clinical deterioration. Furthermore, it is anticipated that the patient will be medically stable for discharge from the hospital within 2 midnights of admission.   Author: Hillary Bow., DO 06/12/2023 8:26 PM  For on call review [email or web address].

## 2023-06-12 NOTE — Assessment & Plan Note (Signed)
Stroke pathway ASA 81 permanently + Plavix 75 for 21 days Tele monitor 2d echo CTA head and neck PT/OT/SLP Neuro consult

## 2023-06-12 NOTE — ED Provider Notes (Signed)
[place] EMERGENCY DEPARTMENT AT [facility] Provider Note   CSN: [id number] Arrival date & time: 06/12/23  1258     History  Chief Complaint  Patient presents with   Dizziness    Kristi Avery is a 67 y.o. female.  Pt is a 67 yo female with pmhx significant for gerd, dm, htn, hld, and anemia.  Pt said she developed some intermittent dizziness yesterday morning.  Her daughter thought she had some slurred speech, so she came in today.  Dizziness is worse with standing and with movement.  Pt denies any weakness in her arms/legs.  No visual disturbance.         Home Medications Prior to Admission medications   Medication Sig Start Date End Date Taking? Authorizing Provider  cyclobenzaprine (FLEXERIL) 10 MG tablet Take 1 tablet (10 mg total) by mouth 3 (three) times daily as needed for muscle spasms. 12/22/16   McVey, Madelaine Bhat, PA-C  fosinopril (MONOPRIL) 40 MG tablet Take 40 mg by mouth daily.    [provider]  irbesartan (AVAPRO) 300 MG tablet Take 300 mg by mouth daily.    [provider]  meloxicam (MOBIC) 7.5 MG tablet Take 1 tablet (7.5 mg total) by mouth daily. 12/22/16   McVey, Madelaine Bhat, PA-C  metFORMIN (GLUCOPHAGE) 500 MG tablet Take 500 mg by mouth 2 (two) times daily with a meal.    [provider]  methotrexate (RHEUMATREX) 2.5 MG tablet Take 2.5 mg by mouth once a week. Caution:Chemotherapy. Protect from light.    [provider]  pantoprazole (PROTONIX) 40 MG tablet Take 40 mg by mouth daily.    [provider]  simvastatin (ZOCOR) 40 MG tablet Take 40 mg by mouth every evening.    [provider]      Allergies    Ultram [tramadol] and Sulfonamide derivatives    Review of Systems   Review of Systems  Neurological:  Positive for dizziness and speech difficulty.  All other systems reviewed and are negative.   Physical Exam Updated Vital Signs BP (!) 173/54 (BP Location: Left Arm)    Pulse 63   Temp 98.2 F (36.8 C) (Oral)   Resp (!) 23   Ht 5\' 1"  (1.549 m)   Wt 63.5 kg   SpO2 100%   BMI 26.45 kg/m  Physical Exam Vitals and nursing note reviewed.  Constitutional:      Appearance: Normal appearance.  HENT:     Head: Normocephalic and atraumatic.     Right Ear: External ear normal.     Left Ear: External ear normal.     Nose: Nose normal.     Mouth/Throat:     Mouth: Mucous membranes are moist.     Pharynx: Oropharynx is clear.  Eyes:     Extraocular Movements: Extraocular movements intact.     Conjunctiva/sclera: Conjunctivae normal.     Pupils: Pupils are equal, round, and reactive to light.  Cardiovascular:     Rate and Rhythm: Normal rate and regular rhythm.     Pulses: Normal pulses.     Heart sounds: Normal heart sounds.  Pulmonary:     Effort: Pulmonary effort is normal.     Breath sounds: Normal breath sounds.  Abdominal:     General: Abdomen is flat. Bowel sounds are normal.     Palpations: Abdomen is soft.  Musculoskeletal:        General: Normal range of motion.     Cervical back:  Normal range of motion and neck supple.  Skin:    General: Skin is warm.     Capillary Refill: Capillary refill takes less than 2 seconds.  Neurological:     General: No focal deficit present.     Mental Status: She is alert and oriented to person, place, and time.     Comments: Speech seems clear to me.  She has a mild right sided facial droop, but her cheeks puff out equally.  Psychiatric:        Mood and Affect: Mood normal.        Behavior: Behavior normal.     ED Results / Procedures / Treatments   Labs (all labs ordered are listed, but only abnormal results are displayed) Labs Reviewed  BASIC METABOLIC PANEL - Abnormal; Notable for the following components:      Result Value   Sodium 134 (*)    CO2 21 (*)    Glucose, Bld 318 (*)    Creatinine, Ser 1.32 (*)    GFR, Estimated 45 (*)    All other components within normal limits  CBC -  Abnormal; Notable for the following components:   HCT 35.9 (*)    Platelets 452 (*)    All other components within normal limits  CBG MONITORING, ED - Abnormal; Notable for the following components:   Glucose-Capillary 295 (*)    All other components within normal limits  URINALYSIS, ROUTINE W REFLEX MICROSCOPIC    EKG EKG Interpretation Date/Time:  Monday June 12 2023 13:05:00 EDT Ventricular Rate:  75 PR Interval:  132 QRS Duration:  97 QT Interval:  373 QTC Calculation: 417 R Axis:   0  Text Interpretation: Sinus rhythm Probable left atrial enlargement Baseline wander in lead(s) I III aVL No significant change since last tracing Confirmed by Jacalyn Lefevre [id number]) on 06/12/2023 2:13:39 PM  Radiology No results found.  Procedures Procedures    Medications Ordered in ED Medications  meclizine (ANTIVERT) tablet 25 mg (25 mg Oral Given 06/12/23 1425)  sodium chloride 0.9 % bolus 1,000 mL (1,000 mLs Intravenous New Bag/Given 06/12/23 1424)    ED Course/ Medical Decision Making/ A&P Clinical Course as of 06/12/23 1556  Mon Jun 12, 2023  1540 Stable HO from [facility] Vertiginous dizziness pending MR. [CC]    Clinical Course User Index [CC] Glyn Ade, MD                                 Medical Decision Making Amount and/or Complexity of Data Reviewed Labs: ordered. Radiology: ordered.   This patient presents to the ED for concern of dizziness, this involves an extensive number of treatment options, and is a complaint that carries with it a high risk of complications and morbidity.  The differential diagnosis includes cva, electrolyte abn, anemia, vertigo   Co morbidities that complicate the patient evaluation  gerd, dm, htn, hld, and anemia   Additional history obtained:  Additional history obtained from epic chart review External records from outside source obtained and reviewed including family   Lab Tests:  I Ordered, and personally interpreted labs.   The pertinent results include:  cbc nl, bmp with glucose elevated at 318   Imaging Studies ordered:  I ordered imaging studies including ct head and mri brain I independently visualized and interpreted imaging which showed  CT head: Pending at shift change I agree with the radiologist interpretation   Cardiac  Monitoring:  The patient was maintained on a cardiac monitor.  I personally viewed and interpreted the cardiac monitored which showed an underlying rhythm of: nsr   Medicines ordered and prescription drug management:  I ordered medication including ivfs  for hyperglycemia  Reevaluation of the patient after these medicines showed that the patient improved I have reviewed the patients home medicines and have made adjustments as needed   Test Considered:  Ct/mri   Critical Interventions:  ivfs   Problem List / ED Course:  Dizziness:  antivert given.  Ct and mri pending.  Pt's sx started yesterday.  She is out of the window for code stroke. Hyperglycemia:  IVFs given.   Reevaluation:  After the interventions noted above, I reevaluated the patient and found that they have :improved   Social Determinants of Health:  Lives at home   Dispostion:  After consideration of the diagnostic results and the patients response to treatment, I feel that the patent would benefit from pending at shift change.          Final Clinical Impression(s) / ED Diagnoses Final diagnoses:  Dizziness  Hyperglycemia due to diabetes mellitus [facility])    Rx / DC Orders ED Discharge Orders     None         Jacalyn Lefevre, MD 06/12/23 1557

## 2023-06-13 ENCOUNTER — Observation Stay (HOSPITAL_COMMUNITY): Payer: No Typology Code available for payment source

## 2023-06-13 DIAGNOSIS — R471 Dysarthria and anarthria: Secondary | ICD-10-CM | POA: Diagnosis not present

## 2023-06-13 DIAGNOSIS — K219 Gastro-esophageal reflux disease without esophagitis: Secondary | ICD-10-CM | POA: Diagnosis not present

## 2023-06-13 DIAGNOSIS — I6329 Cerebral infarction due to unspecified occlusion or stenosis of other precerebral arteries: Secondary | ICD-10-CM | POA: Diagnosis not present

## 2023-06-13 DIAGNOSIS — Z885 Allergy status to narcotic agent status: Secondary | ICD-10-CM | POA: Diagnosis not present

## 2023-06-13 DIAGNOSIS — I639 Cerebral infarction, unspecified: Secondary | ICD-10-CM | POA: Diagnosis not present

## 2023-06-13 DIAGNOSIS — G4700 Insomnia, unspecified: Secondary | ICD-10-CM | POA: Diagnosis not present

## 2023-06-13 DIAGNOSIS — E1165 Type 2 diabetes mellitus with hyperglycemia: Secondary | ICD-10-CM | POA: Diagnosis not present

## 2023-06-13 DIAGNOSIS — Z8249 Family history of ischemic heart disease and other diseases of the circulatory system: Secondary | ICD-10-CM | POA: Diagnosis not present

## 2023-06-13 DIAGNOSIS — I129 Hypertensive chronic kidney disease with stage 1 through stage 4 chronic kidney disease, or unspecified chronic kidney disease: Secondary | ICD-10-CM | POA: Diagnosis not present

## 2023-06-13 DIAGNOSIS — I6523 Occlusion and stenosis of bilateral carotid arteries: Secondary | ICD-10-CM | POA: Diagnosis not present

## 2023-06-13 DIAGNOSIS — M47812 Spondylosis without myelopathy or radiculopathy, cervical region: Secondary | ICD-10-CM | POA: Diagnosis not present

## 2023-06-13 DIAGNOSIS — M810 Age-related osteoporosis without current pathological fracture: Secondary | ICD-10-CM | POA: Diagnosis not present

## 2023-06-13 DIAGNOSIS — Z791 Long term (current) use of non-steroidal anti-inflammatories (NSAID): Secondary | ICD-10-CM | POA: Diagnosis not present

## 2023-06-13 DIAGNOSIS — N1831 Chronic kidney disease, stage 3a: Secondary | ICD-10-CM | POA: Diagnosis present

## 2023-06-13 DIAGNOSIS — F17210 Nicotine dependence, cigarettes, uncomplicated: Secondary | ICD-10-CM | POA: Diagnosis not present

## 2023-06-13 DIAGNOSIS — R29702 NIHSS score 2: Secondary | ICD-10-CM | POA: Diagnosis not present

## 2023-06-13 DIAGNOSIS — E785 Hyperlipidemia, unspecified: Secondary | ICD-10-CM | POA: Diagnosis not present

## 2023-06-13 DIAGNOSIS — D631 Anemia in chronic kidney disease: Secondary | ICD-10-CM | POA: Diagnosis not present

## 2023-06-13 DIAGNOSIS — R42 Dizziness and giddiness: Secondary | ICD-10-CM | POA: Diagnosis present

## 2023-06-13 DIAGNOSIS — L409 Psoriasis, unspecified: Secondary | ICD-10-CM | POA: Diagnosis not present

## 2023-06-13 DIAGNOSIS — Z882 Allergy status to sulfonamides status: Secondary | ICD-10-CM | POA: Diagnosis not present

## 2023-06-13 DIAGNOSIS — E1122 Type 2 diabetes mellitus with diabetic chronic kidney disease: Secondary | ICD-10-CM | POA: Diagnosis not present

## 2023-06-13 DIAGNOSIS — Z716 Tobacco abuse counseling: Secondary | ICD-10-CM | POA: Diagnosis not present

## 2023-06-13 DIAGNOSIS — R29810 Facial weakness: Secondary | ICD-10-CM | POA: Diagnosis not present

## 2023-06-13 DIAGNOSIS — Z79899 Other long term (current) drug therapy: Secondary | ICD-10-CM | POA: Diagnosis not present

## 2023-06-13 DIAGNOSIS — Z7984 Long term (current) use of oral hypoglycemic drugs: Secondary | ICD-10-CM | POA: Diagnosis not present

## 2023-06-13 LAB — LIPID PANEL
Cholesterol: 154 mg/dL (ref 0–200)
HDL: 51 mg/dL (ref >40)
LDL Cholesterol: 84 mg/dL (ref 0–99)
Total CHOL/HDL Ratio: 3 ratio
Triglycerides: 97 mg/dL (ref <150)
VLDL: 19 mg/dL (ref 0–40)

## 2023-06-13 LAB — CBG MONITORING, ED: Glucose-Capillary: 120 mg/dL — ABNORMAL HIGH (ref 70–99)

## 2023-06-13 LAB — HEMOGLOBIN A1C
Hgb A1c MFr Bld: 7.3 % — ABNORMAL HIGH (ref 4.8–5.6)
Mean Plasma Glucose: 162.81 mg/dL

## 2023-06-13 LAB — GLUCOSE, CAPILLARY
Glucose-Capillary: 111 mg/dL — ABNORMAL HIGH (ref 70–99)
Glucose-Capillary: 234 mg/dL — ABNORMAL HIGH (ref 70–99)
Glucose-Capillary: 148 mg/dL — ABNORMAL HIGH (ref 70–99)

## 2023-06-13 LAB — HIV ANTIBODY (ROUTINE TESTING W REFLEX): HIV Screen 4th Generation wRfx: NONREACTIVE

## 2023-06-13 MED ORDER — ATORVASTATIN CALCIUM 80 MG PO TABS: 80.0000 mg | ORAL_TABLET | Freq: Every evening | ORAL | Status: DC

## 2023-06-13 MED ORDER — LABETALOL HCL 5 MG/ML IV SOLN: 10.0000 mg | INTRAVENOUS | Status: DC | PRN

## 2023-06-13 MED ORDER — ATORVASTATIN CALCIUM 80 MG PO TABS
80.0000 mg | ORAL_TABLET | Freq: Every evening | ORAL | Status: DC
  Administered 2023-06-14: 80 mg via ORAL
  Filled 2023-06-13: qty 1

## 2023-06-13 MED ORDER — IRBESARTAN 300 MG PO TABS
300.0000 mg | ORAL_TABLET | Freq: Every day | ORAL | Status: DC
  Administered 2023-06-13 – 2023-06-14 (×2): 300 mg via ORAL
  Filled 2023-06-13 (×2): qty 1

## 2023-06-13 NOTE — Assessment & Plan Note (Signed)
Creat 1.3 today which looks to be about her baseline. Presumably in setting of HTN and DM2.

## 2023-06-13 NOTE — Progress Notes (Signed)
Neurology Stroke APP, D. Pamalee Leyden, NP, paged of pt arrival to [facility] 3W 34 from [facility] ED.

## 2023-06-13 NOTE — Progress Notes (Signed)
Pt arrived to room 3W34 via [facility] from [facility] ED. Received report from Suzanna Obey, Paramedic. See assessment. Will continue to monitor.

## 2023-06-13 NOTE — Progress Notes (Signed)
PT Cancellation Note  Patient Details Name: NOLEE LICANO MRN: [id number] DOB: [birth year]   Cancelled Treatment:     Patient transferring to [facility].will follow after transfer .  Blanchard Kelch PT Acute Rehabilitation Services Office [phone] Weekend pager-[phone]    Rada Hay 06/13/2023, 8:46 AM

## 2023-06-13 NOTE — Plan of Care (Signed)
  Problem: Pain Managment: Goal: General experience of comfort will improve Outcome: Progressing   Problem: Safety: Goal: Ability to remain free from injury will improve Outcome: Progressing   Problem: Skin Integrity: Goal: Risk for impaired skin integrity will decrease Outcome: Progressing   

## 2023-06-13 NOTE — Hospital Course (Addendum)
67 y.o.f w/ HTN, DM2, HLD, chronic anemia, history of gastric ulcer with hemorrhage/GERD, insomnia, psoriasis, arthritis who developed intermittent dizziness 8/18 morning and daughter noticed she had some slurred speech and seen in the ED 8/19. Her dizziness worse with standing and with movement.  No weakness in arms or legs, no visual disturbance. In the ED hypertensive in 170s to 190s systolic, afebrile.  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 Patient was transferred to [facility] for further neurology evaluation and stroke workup.  Going extensive stroke workup A1c 7.3 LDL 84, Lipitor high intensity added- Zocor discontinued, once echo resulted and is stable  Echo ordered with bubble studies > resulted > lvef 60 to 65%. The left ventricle has normal function. The left ventricle has no regional wall motion abnormalities. Left ventricular diastolic parameters are consistent with Grade I diastolic dysfunction No intracardiac source of embolism detected on this transthoracic study.she can be discharged home will go on aspirin 81 Plavix 75 x 3-week afterwards aspirin alone and outpatient PT OT.  Symptoms at this time has resolved.  Encouraged to quit cessation follow-up with PCP for better diabetes control

## 2023-06-13 NOTE — Consult Note (Addendum)
Stroke Team Consultation  Reason for Consult: Stroke on MRI  Referring Physician: Kc  CC: dizziness  History is obtained from:Patient, daughter at bedside  HPI: Kristi Avery is a 67 y.o. female with a PMH of HTN, DM2, HLD who had a sudden onset of dizziness and slurred speech starting on 8/18 at 12 PM.  She denies weakness in the arms or the legs, numbness, tingling, or visual disturbance.  Her dizziness did worsen with standing and with movement.  Her slurred speech has been persistent.  She was found to have an acute stroke on her MRI.  She denies prior strokes.  Her daughter states that her speech is still slurred and she does have a mild right facial droop.  She denies dizziness since being in the hospital.   LKW: 8/18 12pm TNK given?: no, outside of window  ROS: Full ROS was performed and is negative except as noted in the HPI.  Past Medical History:  Diagnosis Date   Abnormal Pap smear 1983   Anemia    Arthritis    Diabetes mellitus without complication (HCC)    Gastric ulcer with hemorrhage 09/01/2012   GERD (gastroesophageal reflux disease)    HLD (hyperlipidemia)    Hypertension    Insomnia    Menorrhagia    Hx   Psoriasis    Ulcer      Family History  Problem Relation Age of Onset   Diabetes Mother    Heart disease Mother    Stroke Father    Diabetes Sister    Diabetes Brother    Colon cancer Neg Hx    Esophageal cancer Neg Hx    Rectal cancer Neg Hx    Stomach cancer Neg Hx    Breast cancer Neg Hx      Social History:   reports that she has been smoking cigarettes. She has a 17.5 pack-year smoking history. She has never used smokeless tobacco. She reports current alcohol use of about 2.0 - 3.0 standard drinks of alcohol per week. She reports that she does not use drugs.  Medications  Current Facility-Administered Medications:    acetaminophen (TYLENOL) tablet 650 mg, 650 mg, Oral, Q4H PRN **OR** acetaminophen (TYLENOL) 160 MG/5ML solution 650 mg, 650  mg, Per Tube, Q4H PRN **OR** acetaminophen (TYLENOL) suppository 650 mg, 650 mg, Rectal, Q4H PRN, Julian Reil, Jared M, DO   aspirin EC tablet 81 mg, 81 mg, Oral, Daily, Julian Reil, Jared M, DO, 81 mg at 06/13/23 1051   clopidogrel (PLAVIX) tablet 75 mg, 75 mg, Oral, Daily, Julian Reil, Jared M, DO, 75 mg at 06/13/23 1051   cyclobenzaprine (FLEXERIL) tablet 5 mg, 5 mg, Oral, TID PRN, Hillary Bow, DO   enoxaparin (LOVENOX) injection 40 mg, 40 mg, Subcutaneous, Q24H, Julian Reil, Jared M, DO, 40 mg at 06/13/23 1051   insulin aspart (novoLOG) injection 0-15 Units, 0-15 Units, Subcutaneous, TID WC, Julian Reil, Jared M, DO   insulin aspart (novoLOG) injection 0-5 Units, 0-5 Units, Subcutaneous, QHS, Gardner, Jared M, DO   labetalol (NORMODYNE) injection 10 mg, 10 mg, Intravenous, Q4H PRN, Kc, Ramesh, MD   pantoprazole (PROTONIX) EC tablet 40 mg, 40 mg, Oral, Daily, Julian Reil, Jared M, DO, 40 mg at 06/13/23 1052   simvastatin (ZOCOR) tablet 40 mg, 40 mg, Oral, Daily, Julian Reil, Jared M, DO, 40 mg at 06/13/23 1051 (Add in PTA meds)   Exam: Current vital signs: BP (!) 161/57 (BP Location: Right Arm)   Pulse 63   Temp 98 F (36.7 C) (Oral)  Resp 17   Ht 5\' 1"  (1.549 m)   Wt 61.3 kg   SpO2 99%   BMI 25.53 kg/m  Vital signs in last 24 hours: Temp:  [97.8 F (36.6 C)-98.4 F (36.9 C)] 98 F (36.7 C) (08/20 1134) Pulse Rate:  [37-78] 63 (08/20 1134) Resp:  [14-25] 17 (08/20 1134) BP: (158-196)/(54-93) 161/57 (08/20 1134) SpO2:  [63 %-100 %] 99 % (08/20 1134) Weight:  [61.3 kg-63.5 kg] 61.3 kg (08/20 0921)  GENERAL: Awake, alert in NAD HEENT: - Normocephalic and atraumatic, moist mm, no LN++, no Thyromegally LUNGS - Clear to auscultation bilaterally with no wheezes CV - S1S2 RRR, no m/r/g, equal pulses bilaterally. ABDOMEN - Soft, nontender, nondistended with normoactive BS Ext: warm, well perfused, intact peripheral pulses, no edema  NEURO:  Mental Status: AA&Ox3  Language: speech is mildly  dysarthric.  Naming, repetition, fluency, and comprehension intact. Cranial Nerves: PERRL. EOMI, visual fields full, slight right nasolabial fold flattening, facial sensation intact, hearing intact, tongue/uvula/soft palate midline, normal sternocleidomastoid and trapezius muscle strength. No evidence of tongue atrophy or fibrillations Motor: Elevates all extremities antigravity without drift Tone: is normal and bulk is normal Sensation- Intact to light touch bilaterally Coordination: FTN intact bilaterally, no ataxia in BLE. Gait- deferred  NIHSS components Score: Comment  1a Level of Conscious 0[x]  1[]  2[]  3[]         1b LOC Questions 0[x]  1[]  2[]           1c LOC Commands 0[x]  1[]  2[]           2 Best Gaze 0[x]  1[]  2[]           3 Visual 0[x]  1[]  2[]  3[]         4 Facial Palsy 0[]  1[x]  2[]  3[]         5a Motor Arm - left 0[x]  1[]  2[]  3[]  4[]  UN[]     5b Motor Arm - Right 0[x]  1[]  2[]  3[]  4[]  UN[]     6a Motor Leg - Left 0[x]  1[]  2[]  3[]  4[]  UN[]     6b Motor Leg - Right 0[x]  1[]  2[]  3[]  4[]  UN[]     7 Limb Ataxia 0[x]  1[]  2[]  3[]  UN[]       8 Sensory 0[x]  1[]  2[]  UN[]         9 Best Language 0[x]  1[]  2[]  3[]         10 Dysarthria 0[]  1[x]  2[]  UN[]         11 Extinct. and Inattention 0[x]  1[]  2[]           TOTAL: 2       Labs I have reviewed labs in epic and the results pertinent to this consultation are:  CBC    Component Value Date/Time   WBC 5.4 06/12/2023 1327   RBC 4.12 06/12/2023 1327   HGB 12.5 06/12/2023 1327   HCT 35.9 (L) 06/12/2023 1327   PLT 452 (H) 06/12/2023 1327   MCV 87.1 06/12/2023 1327   MCV 94.5 08/17/2013 1706   MCH 30.3 06/12/2023 1327   MCHC 34.8 06/12/2023 1327   RDW 13.1 06/12/2023 1327   LYMPHSABS 3.5 03/18/2011 0034   MONOABS 0.5 03/18/2011 0034   EOSABS 0.2 03/18/2011 0034   BASOSABS 0.0 03/18/2011 0034    CMP     Component Value Date/Time   NA 134 (L) 06/12/2023 1327   K 4.5 06/12/2023 1327   CL 105 06/12/2023 1327   CO2 21 (L) 06/12/2023 1327    GLUCOSE 318 (H) 06/12/2023 1327   BUN 23  06/12/2023 1327   CREATININE 1.32 (H) 06/12/2023 1327   CALCIUM 8.9 06/12/2023 1327   PROT 5.1 (L) 09/01/2012 0521   ALBUMIN 2.8 (L) 09/01/2012 0521   AST 27 09/01/2012 0521   ALT 13 09/01/2012 0521   ALKPHOS 43 09/01/2012 0521   BILITOT 0.2 (L) 09/01/2012 0521   GFRNONAA 45 (L) 06/12/2023 1327   GFRAA 70 (L) 09/02/2012 0516    Lipid Panel     Component Value Date/Time   CHOL 154 06/13/2023 0531   TRIG 97 06/13/2023 0531   HDL 51 06/13/2023 0531   CHOLHDL 3.0 06/13/2023 0531   VLDL 19 06/13/2023 0531   LDLCALC 84 06/13/2023 0531     Stroke: Left pontine infarct, etiology: Mild vessel disease Code Stroke CT head- Age indeterminate small left pontine infarct  CTA head & neck Severe stenosis in the left cavernous and left supraclinoid ICA, and moderate stenosis in the right cavernous and right supraclinoid ICA. 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  MRI  Acute nonhemorrhagic left paramedian pontine infarct. More remote lateral pontine infarct corresponds to the finding at CT. Remote linear infarct of the lateral right thalamus. Chronic degenerative changes of the cervical spine as described. 2D Echo pending  LDL 84 HgbA1c 7.3 VTE prophylaxis - lovenox No antithrombotic prior to admission, now on aspirin 81 mg daily and clopidogrel 75 mg daily for 3 weeks and then ASA 81mg  alone Therapy recommendations:  outpt PT  Disposition:  pending   Hypertension Home meds:  irbesartan High on presentation Now stable no the high end Gradually normalize in 48-72 hours Resume home meds Long-term BP goal normotensive  Hyperlipidemia Home meds:  simvastatin 40 LDL 84, goal < 70 Switched to atorvastatin 80mg  Continue statin at discharge  Diabetes type II Uncontrolled Home meds:  metformin HgbA1c 7.3, goal < 7.0 CBGs SSI Close PCP follow up for better DM control  Tobacco abuse Current smoker Smoking cessation  counseling provided Nicotine patch provided Pt is willing to quit  Other Stroke Risk Factors Advanced Age >/= 28  Hx stroke/TIA Previous left pontine strokes noted on MRI, however patient denies known history of stroke  Other Active Problems Osteoporosis On fosamax Psoriasis  On Taltz q28 days  Hospital day # 0   Patient seen and examined by NP/APP with MD. MD to update note as needed.   Elmer Picker, DNP, FNP-BC [facility] Pager: [phone]  ATTENDING NOTE: I reviewed above note and agree with the assessment and plan. Pt was seen and examined.   Daughter and husband are at the bedside. Pt is sitting at the edge of bed for lunch. Pt still has mild right facial droop and mild dysarthria, otherwise neuro intact. Pt stroke likely small vessel disease. Educated on aggressive stroke risk factor modification. Smoking cessation education provided. Continue DAPT for 3 weeks and then ASA alone. Statin now upscaled to lipitor 80. Echo pending. PT recommend outpt. Will follow.  For detailed assessment and plan, please refer to above/below as I have made changes wherever appropriate.   Marvel Plan, MD PhD Stroke Neurology 06/13/2023 5:46 PM

## 2023-06-13 NOTE — Evaluation (Signed)
Physical Therapy Evaluation  Patient Details Name: Kristi Avery MRN: [id number] DOB: [birth year] Today's Date: 06/13/2023  History of Present Illness  Pt is a 67 y/o female who presents to [facility] 06/13/2023 from [facility] with dizziness and slurred speech. 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 PMH significant for DM, HTN, L kidney surgery.   Clinical Impression  Pt admitted with above diagnosis. Pt currently with functional limitations due to the deficits listed below (see PT Problem List). At the time of PT eval pt was able to perform transfers and ambulation with gross CGA to supervision for safety and no AD. Mild R side weakness noted. Recommend follow up at the outpatient level to maximize functional return. Pt will benefit from acute skilled PT to increase their independence and safety with mobility to allow discharge.           If plan is discharge home, recommend the following: A little help with walking and/or transfers;A little help with bathing/dressing/bathroom;Assistance with cooking/housework;Help with stairs or ramp for entrance;Assist for transportation   Can travel by private vehicle        Equipment Recommendations None recommended by PT  Recommendations for Other Services       Functional Status Assessment Patient has had a recent decline in their functional status and demonstrates the ability to make significant improvements in function in a reasonable and predictable amount of time.     Precautions / Restrictions Precautions Precautions:  (Mod fall risk) Restrictions Weight Bearing Restrictions: No      Mobility  Bed Mobility               General bed mobility comments: Pt sitting up EOB when PT arrived. Bed mobility unable to be completed as pt's 2 grandchildren were laying in the bed at end of session.    Transfers Overall transfer level: Needs assistance Equipment used: None Transfers: Sit to/from Stand Sit to Stand:  Supervision           General transfer comment: Light supervision for safety. No overt LOB noted.    Ambulation/Gait Ambulation/Gait assistance: Contact guard assist Gait Distance (Feet): 25 Feet Assistive device: None Gait Pattern/deviations: Step-through pattern, Decreased stride length, Decreased weight shift to right Gait velocity: Decreased Gait velocity interpretation: 1.31 - 2.62 ft/sec, indicative of limited community ambulator   General Gait Details: Ambulating fairly well with no gross LOB noted. Gait appeared guarded and mildly antalgic but pt did not require an AD throughout mobility in the room.  Stairs            Wheelchair Mobility     Tilt Bed    Modified Rankin (Stroke Patients Only) Modified Rankin (Stroke Patients Only) Pre-Morbid Rankin Score: No symptoms Modified Rankin: Moderately severe disability     Balance                                             Pertinent Vitals/Pain Pain Assessment Pain Assessment: Faces Faces Pain Scale: Hurts a little bit Pain Location: low back pain - baseline Pain Descriptors / Indicators: Aching Pain Intervention(s): Limited activity within patient's tolerance, Monitored during session, Repositioned    Home Living Family/patient expects to be discharged to:: Private residence Living Arrangements: Spouse/significant other;Children;Other relatives Available Help at Discharge: Family;Available 24 hours/day Type of Home: House Home Access: Stairs to enter Entrance Stairs-Rails: Right;Left Entrance Stairs-Number of Steps:  5   Home Layout: One level Home Equipment: Cane - single point      Prior Function Prior Level of Function : Independent/Modified Independent;Driving                     Extremity/Trunk Assessment   Upper Extremity Assessment Upper Extremity Assessment: Left hand dominant;RUE deficits/detail RUE Deficits / Details: Noted decreased strength and AROM with  MMT of shoulder flexion, biceps, triceps. Grip strength fairly equal bilaterally; L slightly stronger but pt L handed. RUE drift. Difficult to look at opposition for coordination as pt reports baseline arthritis that limits her.    Lower Extremity Assessment Lower Extremity Assessment: RLE deficits/detail RLE Deficits / Details: Decreased strength in hip flexors and hamstrings. quads and ankle DF equal.    Cervical / Trunk Assessment Cervical / Trunk Assessment: Normal  Communication   Communication Communication: Difficulty communicating thoughts/reduced clarity of speech Cueing Techniques: Verbal cues;Gestural cues  Cognition Arousal: Alert Behavior During Therapy: WFL for tasks assessed/performed Overall Cognitive Status: Within Functional Limits for tasks assessed                                          General Comments      Exercises     Assessment/Plan    PT Assessment Patient needs continued PT services  PT Problem List Decreased strength;Decreased activity tolerance;Decreased balance;Decreased mobility;Decreased knowledge of use of DME;Decreased safety awareness;Decreased knowledge of precautions       PT Treatment Interventions DME instruction;Gait training;Functional mobility training;Stair training;Therapeutic activities;Balance training;Therapeutic exercise;Patient/family education    PT Goals (Current goals can be found in the Care Plan section)  Acute Rehab PT Goals Patient Stated Goal: Home ASAP, back to independence. PT Goal Formulation: With patient/family Time For Goal Achievement: 06/20/23 Potential to Achieve Goals: Good    Frequency Min 1X/week     Co-evaluation               AM-PAC PT "6 Clicks" Mobility  Outcome Measure Help needed turning from your back to your side while in a flat bed without using bedrails?: None Help needed moving from lying on your back to sitting on the side of a flat bed without using bedrails?:  None Help needed moving to and from a bed to a chair (including a wheelchair)?: A Little Help needed standing up from a chair using your arms (e.g., wheelchair or bedside chair)?: A Little Help needed to walk in hospital room?: A Little Help needed climbing 3-5 steps with a railing? : A Little 6 Click Score: 20    End of Session Equipment Utilized During Treatment: Gait belt Activity Tolerance: Patient tolerated treatment well Patient left: in bed;with call bell/phone within reach;with family/visitor present Nurse Communication: Mobility status PT Visit Diagnosis: Unsteadiness on feet (R26.81);Other symptoms and signs involving the nervous system (R29.898)    Time: 2725-3664 PT Time Calculation (min) (ACUTE ONLY): 16 min   Charges:   PT Evaluation $PT Eval Low Complexity: 1 Low   PT General Charges $$ ACUTE PT VISIT: 1 Visit         Conni Slipper, PT, DPT Acute Rehabilitation Services Secure Chat Preferred Office: [phone]   Marylynn Pearson 06/13/2023, 5:19 PM

## 2023-06-13 NOTE — ED Notes (Signed)
Report given to 3W RN   [facility] called

## 2023-06-13 NOTE — Evaluation (Signed)
Speech Language Pathology Evaluation Patient Details Name: Kristi Avery MRN: [id number] DOB: 1956/01/08 Today's Date: 06/13/2023 Time: 1140-1201 SLP Time Calculation (min) (ACUTE ONLY): 21 min  Problem List:  Patient Active Problem List   Diagnosis Date Noted   CKD stage 3a, GFR 45-59 ml/min (HCC) 06/13/2023   Acute ischemic stroke (HCC) 06/12/2023   Dysuria 10/31/2016   Urinary frequency 10/31/2016   Clinical infection 10/31/2016   Acute left-sided low back pain without sciatica 10/31/2016   Fibroids 03/28/2012   Menopausal symptoms 03/28/2012   Psoriasis 03/28/2012   Type II diabetes mellitus (HCC) 03/28/2012   Hypertension 03/28/2012   Hyperlipidemia 03/28/2012   Insomnia    GERD (gastroesophageal reflux disease)    Past Medical History:  Past Medical History:  Diagnosis Date   Abnormal Pap smear 1983   Anemia    Arthritis    Diabetes mellitus without complication (HCC)    Gastric ulcer with hemorrhage 09/01/2012   GERD (gastroesophageal reflux disease)    HLD (hyperlipidemia)    Hypertension    Insomnia    Menorrhagia    Hx   Psoriasis    Ulcer    Past Surgical History:  Past Surgical History:  Procedure Laterality Date   COLONOSCOPY  2007   ESOPHAGOGASTRODUODENOSCOPY  09/01/2012   Procedure: ESOPHAGOGASTRODUODENOSCOPY (EGD);  Surgeon: Louis Meckel, MD;  Location: [facility];  Service: Endoscopy;  Laterality: N/A;   KIDNEY SURGERY     left   TUBAL LIGATION     HPI:  Kristi Avery is a 67 y.o. female with medical history significant of HTN, DM2, HLD. Pt had onset of dizziness and slurred speech yesterday at 12pm.  Dizziness was intermittent. Slurred speech persistent through today.  Dizziness worse with standing and with movement.  No weakness in arms or legs, no visual disturbance. MRI Acute nonhemorrhagic left paramedian pontine infarct, More remote lateral pontine infarct corresponds to the finding at CT, remote linear infarct of the lateral right thalamus.    Assessment / Plan / Recommendation Clinical Impression  Pt seen for speech-language-cognitive assessment. She denies cognitive issues but states her speech is improved but still a bit slurred. Pt is independent living with her husband and daughter and helps with finances and manages her own medications. She presents with mild dysarthria marked by increased rate and decreased articulatory precision although her speech is mostly intelligible. She was givne the Cognistat in which she scored within the average range on all subtests except for mental calcuation. Education to facilitate her dysarthria was given and examples provided. When she spoke with increased rate and softer intensity she was cued to stop and repeat with greater vocal intensity and precision which was significant to improve her intelligibility. Pt voiced understanding and asked her husband to reiterate this with her. At this point, no further ST is needed as education has been completed and ST will sign off.    SLP Assessment  SLP Recommendation/Assessment: Patient does not need any further Speech Lanaguage Pathology Services SLP Visit Diagnosis: Dysarthria and anarthria (R47.1)    Recommendations for follow up therapy are one component of a multi-disciplinary discharge planning process, led by the attending physician.  Recommendations may be updated based on patient status, additional functional criteria and insurance authorization.    Follow Up Recommendations  No SLP follow up    Assistance Recommended at Discharge  None  Functional Status Assessment Patient has not had a recent decline in their functional status  Frequency and Duration  SLP Evaluation Cognition  Overall Cognitive Status: Within Functional Limits for tasks assessed Arousal/Alertness: Awake/alert Orientation Level: Oriented X4 Year: 2024 Month: August Day of Week: Correct Attention: Sustained Sustained Attention: Appears intact Memory:  Appears intact (4/4 words) Awareness: Appears intact Problem Solving: Appears intact Safety/Judgment: Appears intact       Comprehension  Auditory Comprehension Overall Auditory Comprehension: Appears within functional limits for tasks assessed Commands: Within Functional Limits Conversation: Simple Visual Recognition/Discrimination Discrimination: Not tested Reading Comprehension Reading Status: Not tested    Expression Expression Primary Mode of Expression: Verbal Verbal Expression Overall Verbal Expression: Appears within functional limits for tasks assessed Initiation: No impairment Level of Generative/Spontaneous Verbalization: Conversation Repetition: No impairment Naming: No impairment Pragmatics: No impairment Written Expression Written Expression: Not tested   Oral / Motor  Oral Motor/Sensory Function Overall Oral Motor/Sensory Function: Mild impairment Facial ROM: Reduced right;Suspected CN VII (facial) dysfunction Facial Symmetry: Abnormal symmetry right;Suspected CN VII (facial) dysfunction Facial Strength: Reduced right;Suspected CN VII (facial) dysfunction Motor Speech Overall Motor Speech: Impaired Respiration: Within functional limits Phonation: Normal Resonance: Within functional limits Articulation: Impaired Level of Impairment: Sentence Intelligibility: Intelligible Motor Planning: Witnin functional limits Motor Speech Errors: Not applicable Effective Techniques: Slow rate;Increased vocal intensity;Over-articulate            Royce Macadamia 06/13/2023, 12:36 PM

## 2023-06-13 NOTE — ED Notes (Signed)
Called to give report to RN  3W advised they would call back to receive report

## 2023-06-13 NOTE — Progress Notes (Signed)
PROGRESS NOTE Kristi Avery  VOZ:[id number] DOB: 07-09-1956 DOA: 06/12/2023 PCP: [name], MD  Brief Narrative/Hospital Course: 67 y.o.f w/ HTN, DM2, HLD, chronic anemia, history of gastric ulcer with hemorrhage/GERD, insomnia, psoriasis, arthritis who developed intermittent dizziness 8/18 morning and daughter noticed she had some slurred speech and seen in the ED 8/19. Her dizziness worse with standing and with movement.  No weakness in arms or legs, no visual disturbance. In the ED hypertensive in 170s to 190s systolic, afebrile.  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 Patient was transferred to [facility] for further neurology evaluation and stroke workup.     Subjective: Patient seen and examined this morning resting comfortably mild speech slurring speech in the room evaluating. 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 Neurology has been notified complete  stroke workup as below MRI brain>acute nonhemorrhagic left paramedian pontine infarct, more remote lateral pontine infarct and also remote lunar infarct on thalamus and chronic degenerative changes.  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 BP goal : Permissive HTN upto 220/110 mmHg  F/u Echocardiogram Prophylactic therapy-ASA + Plavix  High intensity Statin switch zocor 40 to lipitor 8 as LDL > 70 HgbA1c 7.3- cont ssi PT consult, OT consult, Speech consult Telemetry monitoring Frequent neuro checks Stroke swallow screen   CKD stage 3a: creat 1.3 > monitor   Hyperlipidemia: LDL not at goal 84, on Zocor 40 mg PTA.  Increase statin to Lipitor 80mg    Hypertension: On Avapro 30 mg PTA.Allow permissive hypertension in the acute stroke setting  for 24-48 hrs unless 220/120   Type II diabetes mellitus with uncontrolled hyperglycemia: HbA1c pending, hold metformin, continue SSI for now. Recent Labs  Lab 06/12/23 1306 06/12/23 2144 06/13/23 0531 06/13/23 0739 06/13/23 1135  GLUCAP 295* 137*  --  120* 111*  HGBA1C  --   --  7.3*  --   --     Psoriasis: On biologic, Altamease Oiler, every 28 days for this.  GERD History of gastric ulcer with hemorrhage: Continue Protonix especially while on antiplatelets.  Hemoglobin is stable  DVT prophylaxis: enoxaparin (LOVENOX) injection 40 mg Start: 06/13/23 1000 Code Status:   Code Status: Full Code Family Communication: plan of care discussed with patient at bedside. Patient status is: inpatient because of  stroke Level of care: Telemetry Medical   Dispo: The patient is from: home w/ daughter and has been  Anticipated disposition: TBD Objective: Vitals last 24 hrs: Vitals:   06/13/23 0748 06/13/23 0921 06/13/23 0921 06/13/23 1134  BP: (!) 180/75  (!) 191/81 (!) 161/57  Pulse: 66   72 63  Resp: 16  16 17   Temp: 97.9 F (36.6 C)  97.9 F (36.6 C) 98 F (36.7 C)  TempSrc: Oral  Oral Oral  SpO2: 100%  100% 99%  Weight:  61.3 kg    Height:  5\' 1"  (1.549 m)     Weight change:   Physical Examination: General exam: alert awake, older than stated age HEENT:Oral mucosa moist, Ear/Nose WNL grossly Respiratory system: bilaterally clear BS, no use of accessory muscle Cardiovascular system: S1 & S2 +, No JVD. Gastrointestinal system: Abdomen soft,NT,ND, BS+ Nervous System:Alert, awake, moving all extremities, facial droop and mild speech slurring present Extremities: LE edema neg,distal peripheral pulses palpable.  Skin: No rashes,no icterus. MSK: Normal muscle bulk,tone, power  Medications reviewed:  Scheduled Meds:  aspirin EC  81 mg Oral Daily   clopidogrel  75 mg Oral Daily   enoxaparin (LOVENOX) injection  40 mg Subcutaneous Q24H   insulin aspart  0-15 Units Subcutaneous TID WC   insulin aspart  0-5 Units Subcutaneous QHS   pantoprazole  40 mg Oral Daily   simvastatin  40 mg Oral Daily   Continuous Infusions:    Diet Order             Diet Carb Modified Fluid consistency: Thin; Room service appropriate? Yes  Diet effective now                     Intake/Output Summary (Last 24 hours) at 06/13/2023 1158 Last data filed at 06/13/2023 1050 Gross per 24 hour  Intake 1240 ml  Output --  Net 1240 ml   Net IO Since Admission: 1,240 mL [06/13/23 1158]  Wt Readings from Last 3 Encounters:  06/13/23 61.3 kg  12/22/16 70.3 kg  10/31/16 68.5 kg     Unresulted Labs (From admission, onward)    None     Data Reviewed: I have personally reviewed following labs and imaging studies CBC: Recent Labs  Lab 06/12/23 1327  WBC 5.4  HGB 12.5  HCT 35.9*  MCV 87.1  PLT 452*   Basic Metabolic Panel: Recent Labs  Lab 06/12/23 1327  NA 134*  K 4.5  CL 105  CO2 21*  GLUCOSE 318*  BUN 23  CREATININE 1.32*  CALCIUM 8.9   Recent Labs     06/13/23 0531  HGBA1C 7.3*  CBG: Recent Labs  Lab 06/12/23 1306 06/12/23 2144 06/13/23 0739 06/13/23 1135  GLUCAP 295* 137* 120* 111*  Lipid Profile: Recent Labs    06/13/23 0531  CHOL 154  HDL 51  LDLCALC 84  TRIG 97  CHOLHDL 3.0   No results found for this or any previous visit (from the past 240 hour(s)).  Antimicrobials: Anti-infectives (From admission, onward)    None      Culture/Microbiology No results found for: "SDES", "SPECREQUEST", "CULT", "REPTSTATUS"  Radiology Studies: CT ANGIO HEAD NECK W WO CM  Result Date: 06/12/2023 CLINICAL DATA:  Dizzy, slurred speech, headache, acute pontine infarct on MRI EXAM: CT ANGIOGRAPHY HEAD AND NECK WITH AND WITHOUT CONTRAST TECHNIQUE: Multidetector CT imaging of the head and neck was performed using the standard protocol during bolus administration of intravenous contrast. Multiplanar CT image reconstructions and MIPs were obtained to evaluate the vascular anatomy. Carotid stenosis measurements (when applicable) are obtained utilizing  NASCET criteria, using the distal internal carotid diameter as the denominator. RADIATION DOSE REDUCTION: This exam was performed according to the departmental dose-optimization program which includes automated exposure control, adjustment of the mA and/or kV according to patient size and/or use of iterative reconstruction technique. CONTRAST:  75mL OMNIPAQUE IOHEXOL 350 MG/ML SOLN COMPARISON:  No prior CTA available, correlation is made with 06/12/2023 CT head and MRI head FINDINGS: CT HEAD FINDINGS Brain: Evaluation of the pons is somewhat limited by artifact related to patient's calvarium. This includes the area of acute infarct noted on the same-day MRI. Redemonstrated more remote left lateral pontine infarct. No other acute infarct. No acute hemorrhage, mass, mass effect, or midline shift. No hydrocephalus or extra-axial fluid collection. Vascular: No hyperdense vessel. Skull: Negative for fracture or  focal lesion. Sinuses/Orbits: No acute finding. Other: The mastoid air cells are well aerated. CTA NECK FINDINGS Aortic arch: Standard branching. Imaged portion shows no evidence of aneurysm or dissection. No significant stenosis of the major arch vessel origins. Aortic atherosclerosis. Right carotid system: No evidence of dissection, occlusion, or hemodynamically significant stenosis (greater than 50%). Atherosclerotic disease at the bifurcation and in the proximal ICA is not hemodynamically significant. Left carotid system: No evidence of dissection, occlusion, or hemodynamically significant stenosis (greater than 50%). Atherosclerotic disease at the bifurcation and in the proximal ICA is not hemodynamically significant. Vertebral arteries: Severe stenosis at the origin of the left vertebral artery. Scattered calcifications in the left V2 segment, without significant stenosis. The right vertebral artery is patent from its origin to the skull base without significant stenosis. Skeleton: No acute osseous abnormality. Degenerative changes in the cervical spine. Other neck: 1.2 cm hypoenhancing nodule in the right thyroid lobe, for which no follow-up is currently indicated. Upper chest: No focal pulmonary opacity or pleural effusion. Mild emphysema. Review of the MIP images confirms the above findings CTA HEAD FINDINGS Anterior circulation: Both internal carotid arteries are patent to the termini, with severe stenosis in the left cavernous segment and left supraclinoid segment, and moderate stenosis in the right cavernous segment and right supraclinoid segment. Patent right A1. Aplastic left A1. Normal anterior communicating artery. Anterior cerebral arteries are patent to their distal aspects without significant stenosis. No M1 stenosis or occlusion. MCA branches perfused to their distal aspects without significant stenosis. Posterior circulation: Vertebral arteries patent to the vertebrobasilar junction without  significant stenosis. Posterior inferior cerebellar arteries patent proximally. Basilar patent to its distal aspect without significant stenosis. Superior cerebellar arteries patent proximally. Patent P1 segments. PCAs perfused to their distal aspects without significant stenosis. The left posterior communicating artery is diminutive but patent. Venous sinuses: As permitted by contrast timing, patent. Anatomic variants: None significant. Review of the MIP images confirms the above findings IMPRESSION: 1. 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 2. 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 No other hemodynamically significant stenosis in the neck. 3. No intracranial large vessel occlusion. 4. Aortic atherosclerosis. Aortic Atherosclerosis (ICD10-I70.0). Electronically Signed   By: Wiliam Ke M.D.   On: 06/12/2023 21:39   MR BRAIN WO CONTRAST  Result Date: 06/12/2023 CLINICAL DATA:  Neuro deficit, acute, stroke suspected. Patient began feeling dizzy with slurred speech yesterday at 12 noon. Persistent headache. EXAM: MRI HEAD WITHOUT CONTRAST TECHNIQUE: Multiplanar, multiecho pulse sequences of the brain and surrounding structures were obtained without intravenous contrast. COMPARISON:  CT head without contrast 06/12/2023 FINDINGS: Brain:  Acute nonhemorrhagic left paramedian pontine infarct is present. Infarct measures up to 15 mm anterior to posterior and 6 mm cephalo caudad. Subtle T2 and FLAIR hyperintensity is associated. A more remote lateral pontine infarct corresponds to the finding at CT. No other acute infarct is present. Remote linear infarct is present in the lateral right thalamus. Deep brain nuclei are within normal limits. No significant white matter lesions are present. The ventricles are of normal size. No significant extraaxial fluid collection is present. The more inferior  brainstem is within normal limits. Cerebellar hemispheres are normal bilaterally. Mm cephalo caudad. Vascular: Flow is present in the major intracranial arteries. Skull and upper cervical spine: The craniocervical junction is normal. Upper cervical spine is within normal limits. Chronic degenerative changes are present at C3-4 with a broad-based disc osteophyte complex resulting in central canal stenosis. Chronic loss of vertebral body height is present at both C3 and C4. Craniocervical junction is normal. The sella is somewhat expanded. Pituitary is unremarkable. Marrow signal is otherwise normal. Sinuses/Orbits: The paranasal sinuses and mastoid air cells are clear. The globes and orbits are within normal limits. IMPRESSION: 1. Acute nonhemorrhagic left paramedian pontine infarct. 2. More remote lateral pontine infarct corresponds to the finding at CT. 3. Remote linear infarct of the lateral right thalamus. 4. Chronic degenerative changes of the cervical spine as described. Electronically Signed   By: Marin Roberts M.D.   On: 06/12/2023 19:56   CT Head Wo Contrast  Result Date: 06/12/2023 CLINICAL DATA:  Neuro deficit, acute, stroke suspected. EXAM: CT HEAD WITHOUT CONTRAST TECHNIQUE: Contiguous axial images were obtained from the base of the skull through the vertex without intravenous contrast. RADIATION DOSE REDUCTION: This exam was performed according to the departmental dose-optimization program which includes automated exposure control, adjustment of the mA and/or kV according to patient size and/or use of iterative reconstruction technique. COMPARISON:  None Available. FINDINGS: Brain: Age indeterminate small left pontine infarct. No evidence of acute hemorrhage, hydrocephalus, extra-axial collection or mass lesion/mass effect. Vascular: No hyperdense vessel identified. Skull: No acute fracture. Sinuses/Orbits: Clear sinuses.  No acute findings. Other: No mastoid effusions. IMPRESSION: Age  indeterminate small left pontine infarct.  Recommend MRI. Electronically Signed   By: Feliberto Harts M.D.   On: 06/12/2023 16:21     LOS: 0 days   Lanae Boast, MD [facility]  06/13/2023, 11:58 AM

## 2023-06-13 NOTE — Progress Notes (Signed)
OT Cancellation Note  Patient Details Name: Kristi Avery MRN: [id number] DOB: Mar 12, 1956   Cancelled Treatment:    Reason Eval/Treat Not Completed: Other (comment) [facility] is here in ED to transfer patient. OT to continue to follow and complete eval at next venue as time will allow.  Rosalio Loud, MS Acute Rehabilitation Department Office# [phone]  06/13/2023, 8:45 AM

## 2023-06-13 NOTE — Assessment & Plan Note (Signed)
Cont home Statin Check FLP

## 2023-06-13 NOTE — Assessment & Plan Note (Addendum)
Looks like pt takes biologic, Altamease Oiler, every 28 days for this.

## 2023-06-14 ENCOUNTER — Observation Stay (HOSPITAL_COMMUNITY): Payer: No Typology Code available for payment source

## 2023-06-14 ENCOUNTER — Other Ambulatory Visit (HOSPITAL_COMMUNITY): Payer: Self-pay

## 2023-06-14 DIAGNOSIS — N1831 Chronic kidney disease, stage 3a: Secondary | ICD-10-CM | POA: Diagnosis present

## 2023-06-14 DIAGNOSIS — M810 Age-related osteoporosis without current pathological fracture: Secondary | ICD-10-CM | POA: Diagnosis present

## 2023-06-14 DIAGNOSIS — Z8249 Family history of ischemic heart disease and other diseases of the circulatory system: Secondary | ICD-10-CM | POA: Diagnosis not present

## 2023-06-14 DIAGNOSIS — E1122 Type 2 diabetes mellitus with diabetic chronic kidney disease: Secondary | ICD-10-CM | POA: Diagnosis present

## 2023-06-14 DIAGNOSIS — Z882 Allergy status to sulfonamides status: Secondary | ICD-10-CM | POA: Diagnosis not present

## 2023-06-14 DIAGNOSIS — R29702 NIHSS score 2: Secondary | ICD-10-CM | POA: Diagnosis present

## 2023-06-14 DIAGNOSIS — D631 Anemia in chronic kidney disease: Secondary | ICD-10-CM | POA: Diagnosis present

## 2023-06-14 DIAGNOSIS — I6389 Other cerebral infarction: Secondary | ICD-10-CM

## 2023-06-14 DIAGNOSIS — G4700 Insomnia, unspecified: Secondary | ICD-10-CM | POA: Diagnosis present

## 2023-06-14 DIAGNOSIS — I639 Cerebral infarction, unspecified: Secondary | ICD-10-CM | POA: Diagnosis not present

## 2023-06-14 DIAGNOSIS — E1165 Type 2 diabetes mellitus with hyperglycemia: Secondary | ICD-10-CM | POA: Diagnosis present

## 2023-06-14 DIAGNOSIS — I6329 Cerebral infarction due to unspecified occlusion or stenosis of other precerebral arteries: Secondary | ICD-10-CM | POA: Diagnosis present

## 2023-06-14 DIAGNOSIS — Z791 Long term (current) use of non-steroidal anti-inflammatories (NSAID): Secondary | ICD-10-CM | POA: Diagnosis not present

## 2023-06-14 DIAGNOSIS — F17210 Nicotine dependence, cigarettes, uncomplicated: Secondary | ICD-10-CM | POA: Diagnosis present

## 2023-06-14 DIAGNOSIS — K219 Gastro-esophageal reflux disease without esophagitis: Secondary | ICD-10-CM | POA: Diagnosis present

## 2023-06-14 DIAGNOSIS — Z885 Allergy status to narcotic agent status: Secondary | ICD-10-CM | POA: Diagnosis not present

## 2023-06-14 DIAGNOSIS — L409 Psoriasis, unspecified: Secondary | ICD-10-CM | POA: Diagnosis present

## 2023-06-14 DIAGNOSIS — R29810 Facial weakness: Secondary | ICD-10-CM | POA: Diagnosis present

## 2023-06-14 DIAGNOSIS — R471 Dysarthria and anarthria: Secondary | ICD-10-CM | POA: Diagnosis present

## 2023-06-14 DIAGNOSIS — Z79899 Other long term (current) drug therapy: Secondary | ICD-10-CM | POA: Diagnosis not present

## 2023-06-14 DIAGNOSIS — I129 Hypertensive chronic kidney disease with stage 1 through stage 4 chronic kidney disease, or unspecified chronic kidney disease: Secondary | ICD-10-CM | POA: Diagnosis present

## 2023-06-14 DIAGNOSIS — Z7984 Long term (current) use of oral hypoglycemic drugs: Secondary | ICD-10-CM | POA: Diagnosis not present

## 2023-06-14 DIAGNOSIS — M47812 Spondylosis without myelopathy or radiculopathy, cervical region: Secondary | ICD-10-CM | POA: Diagnosis present

## 2023-06-14 DIAGNOSIS — Z716 Tobacco abuse counseling: Secondary | ICD-10-CM | POA: Diagnosis not present

## 2023-06-14 DIAGNOSIS — R42 Dizziness and giddiness: Secondary | ICD-10-CM | POA: Diagnosis present

## 2023-06-14 DIAGNOSIS — I6523 Occlusion and stenosis of bilateral carotid arteries: Secondary | ICD-10-CM | POA: Diagnosis present

## 2023-06-14 DIAGNOSIS — E785 Hyperlipidemia, unspecified: Secondary | ICD-10-CM | POA: Diagnosis present

## 2023-06-14 LAB — ECHOCARDIOGRAM COMPLETE
AV Mean grad: 4 mmHg
AV Peak grad: 7.6 mmHg
Ao pk vel: 1.38 m/s
Area-P 1/2: 2.55 cm2
Height: 61 in
S' Lateral: 2.6 cm
Weight: 2162.27 [oz_av]

## 2023-06-14 LAB — GLUCOSE, CAPILLARY
Glucose-Capillary: 128 mg/dL — ABNORMAL HIGH (ref 70–99)
Glucose-Capillary: 221 mg/dL — ABNORMAL HIGH (ref 70–99)
Glucose-Capillary: 133 mg/dL — ABNORMAL HIGH (ref 70–99)
Glucose-Capillary: 173 mg/dL — ABNORMAL HIGH (ref 70–99)

## 2023-06-14 LAB — BASIC METABOLIC PANEL
Anion gap: 12 (ref 5–15)
BUN: 16 mg/dL (ref 8–23)
CO2: 21 mmol/L — ABNORMAL LOW (ref 22–32)
Calcium: 8.9 mg/dL (ref 8.9–10.3)
Chloride: 105 mmol/L (ref 98–111)
Creatinine, Ser: 1.59 mg/dL — ABNORMAL HIGH (ref 0.44–1.00)
GFR, Estimated: 36 mL/min — ABNORMAL LOW (ref >60)
Glucose, Bld: 283 mg/dL — ABNORMAL HIGH (ref 70–99)
Potassium: 4.4 mmol/L (ref 3.5–5.1)
Sodium: 138 mmol/L (ref 135–145)

## 2023-06-14 MED ORDER — ASPIRIN 81 MG PO TBEC
81.0000 mg | DELAYED_RELEASE_TABLET | Freq: Every day | ORAL | 3 refills | Status: AC
  Filled 2023-06-14: qty 120, 120d supply, fill #0

## 2023-06-14 MED ORDER — PANTOPRAZOLE SODIUM 40 MG PO TBEC
40.0000 mg | DELAYED_RELEASE_TABLET | Freq: Every day | ORAL | 0 refills | Status: AC
  Filled 2023-06-14: qty 21, 21d supply, fill #0

## 2023-06-14 MED ORDER — ATORVASTATIN CALCIUM 80 MG PO TABS
80.0000 mg | ORAL_TABLET | Freq: Every evening | ORAL | 0 refills | Status: DC
  Filled 2023-06-14: qty 30, 30d supply, fill #0

## 2023-06-14 MED ORDER — CLOPIDOGREL BISULFATE 75 MG PO TABS
75.0000 mg | ORAL_TABLET | Freq: Every day | ORAL | 0 refills | Status: AC
Start: 2023-06-15 — End: 2023-07-06
  Filled 2023-06-14: qty 21, 21d supply, fill #0

## 2023-06-14 MED ORDER — SODIUM CHLORIDE 0.9 % IV SOLN: INTRAVENOUS | Status: DC

## 2023-06-14 MED ORDER — AMLODIPINE BESYLATE 5 MG PO TABS
5.0000 mg | ORAL_TABLET | Freq: Every day | ORAL | Status: DC
  Administered 2023-06-14: 5 mg via ORAL
  Filled 2023-06-14: qty 1

## 2023-06-14 MED ORDER — HYDRALAZINE HCL 20 MG/ML IJ SOLN
5.0000 mg | Freq: Four times a day (QID) | INTRAMUSCULAR | Status: DC | PRN
  Administered 2023-06-14: 5 mg via INTRAVENOUS
  Filled 2023-06-14: qty 1

## 2023-06-14 NOTE — Evaluation (Signed)
Occupational Therapy Evaluation Patient Details Name: Kristi Avery MRN: [id number] DOB: June 17, 1956 Today's Date: 06/14/2023   History of Present Illness Pt is a 67 y/o female who presents to [facility] 06/13/2023 from [facility] with dizziness and slurred speech. 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 PMH significant for DM, HTN, L kidney surgery.   Clinical Impression   Patient evaluated by Occupational Therapy with no further acute OT needs identified. All education has been completed and the patient has no further questions. See below for any follow-up Occupational Therapy or equipment needs. OT to sign off. Thank you for referral.     Pt with deficits alternating steps and changing speeds. Pt advised to have family assist for steps, curbs and escalators (as she goes to the mall with grandchildren and now plans to use elevator)    If plan is discharge home, recommend the following:      Functional Status Assessment  Patient has had a recent decline in their functional status and demonstrates the ability to make significant improvements in function in a reasonable and predictable amount of time.  Equipment Recommendations  None recommended by OT    Recommendations for Other Services       Precautions / Restrictions Precautions Precautions: Fall      Mobility Bed Mobility Overal bed mobility: Modified Independent                  Transfers Overall transfer level: Modified independent                        Balance Overall balance assessment: Mild deficits observed, not formally tested                               Standardized Balance Assessment Standardized Balance Assessment : Berg Balance Test, Dynamic Gait Index Berg Balance Test Sit to Stand: Able to stand without using hands and stabilize independently Standing Unsupported: Able to stand safely 2 minutes Sitting with Back Unsupported but Feet Supported on Floor or Stool:  Able to sit safely and securely 2 minutes Stand to Sit: Sits safely with minimal use of hands Transfers: Able to transfer safely, minor use of hands Standing Unsupported with Eyes Closed: Able to stand 10 seconds safely Standing Ubsupported with Feet Together: Able to place feet together independently and stand 1 minute safely From Standing, Reach Forward with Outstretched Arm: Can reach confidently >25 cm (10") From Standing Position, Pick up Object from Floor: Able to pick up shoe safely and easily From Standing Position, Turn to Look Behind Over each Shoulder: Looks behind from both sides and weight shifts well Turn 360 Degrees: Able to turn 360 degrees safely in 4 seconds or less Standing Unsupported, Alternately Place Feet on Step/Stool: Able to stand independently and complete 8 steps >20 seconds Standing Unsupported, One Foot in Front: Able to place foot tandem independently and hold 30 seconds Standing on One Leg: Able to lift leg independently and hold 5-10 seconds Total Score: 54 Dynamic Gait Index Level Surface: Normal Change in Gait Speed: Mild Impairment Gait with Horizontal Head Turns: Normal Gait with Vertical Head Turns: Normal Gait and Pivot Turn: Normal Step Over Obstacle: Normal Step Around Obstacles: Normal     ADL either performed or assessed with clinical judgement   ADL Overall ADL's : Modified independent  General ADL Comments: pt demonstrates balance activities, don doff socks, bed mobility , openng tea container and pour into a cup. pt carries two cups one with ice and one with water back from nurses station without spillage     Vision Baseline Vision/History: 1 Wears glasses Ability to See in Adequate Light: 0 Adequate Vision Assessment?: No apparent visual deficits     Perception         Praxis         Pertinent Vitals/Pain Pain Assessment Pain Assessment: No/denies pain      Extremity/Trunk Assessment Upper Extremity Assessment Upper Extremity Assessment: Left hand dominant   Lower Extremity Assessment Lower Extremity Assessment: Defer to PT evaluation   Cervical / Trunk Assessment Cervical / Trunk Assessment: Normal   Communication Communication Communication: Difficulty communicating thoughts/reduced clarity of speech (pt states its i talk slow and loud i can do it)   Cognition Arousal: Alert Behavior During Therapy: WFL for tasks assessed/performed Overall Cognitive Status: Within Functional Limits for tasks assessed                                       General Comments       Exercises     Shoulder Instructions      Home Living Family/patient expects to be discharged to:: Private residence Living Arrangements: Spouse/significant other Available Help at Discharge: Family;Available 24 hours/day Type of Home: House Home Access: Stairs to enter [employer] of Steps: 5 Entrance Stairs-Rails: Right;Left Home Layout: One level     Bathroom Shower/Tub: Chief Strategy Officer: Standard     Home Equipment: Medical laboratory scientific officer - single point      Lives With: Daughter;Spouse    Prior Functioning/Environment Prior Level of Function : Independent/Modified Independent;Driving                        OT Problem List:        OT Treatment/Interventions:      OT Goals(Current goals can be found in the care plan section) Acute Rehab OT Goals Patient Stated Goal: to go home today Potential to Achieve Goals: Good  OT Frequency:      Co-evaluation              AM-PAC OT "6 Clicks" Daily Activity     Outcome Measure Help from another person eating meals?: None Help from another person taking care of personal grooming?: None Help from another person toileting, which includes using toliet, bedpan, or urinal?: None Help from another person bathing (including washing, rinsing, drying)?: None Help  from another person to put on and taking off regular upper body clothing?: None Help from another person to put on and taking off regular lower body clothing?: None 6 Click Score: 24   End of Session Equipment Utilized During Treatment: Gait belt Nurse Communication: Mobility status;Precautions  Activity Tolerance: Patient tolerated treatment well Patient left: in chair;with call bell/phone within reach  OT Visit Diagnosis: Unsteadiness on feet (R26.81)                Time: 1142-1153 OT Time Calculation (min): 11 min Charges:  OT General Charges $OT Visit: 1 Visit OT Evaluation $OT Eval Low Complexity: 1 Low   Brynn, OTR/L  Acute Rehabilitation Services Office: [phone] .   Mateo Flow 06/14/2023, 11:58 AM

## 2023-06-14 NOTE — Plan of Care (Signed)
  Problem: Education: Goal: Knowledge of disease or condition will improve Outcome: Progressing   Problem: Ischemic Stroke/TIA Tissue Perfusion: Goal: Complications of ischemic stroke/TIA will be minimized Outcome: Progressing   Problem: Coping: Goal: Will identify appropriate support needs Outcome: Progressing   Problem: Self-Care: Goal: Ability to communicate needs accurately will improve Outcome: Progressing   Problem: Nutrition: Goal: Risk of aspiration will decrease Outcome: Progressing

## 2023-06-14 NOTE — Progress Notes (Signed)
PROGRESS NOTE BERNARDINA SHABBIR  NFA:[id number] DOB: Oct 28, 1955 DOA: 06/12/2023 PCP: [name], MD  Brief Narrative/Hospital Course: 67 y.o.f w/ HTN, DM2, HLD, chronic anemia, history of gastric ulcer with hemorrhage/GERD, insomnia, psoriasis, arthritis who developed intermittent dizziness 8/18 morning and daughter noticed she had some slurred speech and seen in the ED 8/19. Her dizziness worse with standing and with movement.  No weakness in arms or legs, no visual disturbance. In the ED hypertensive in 170s to 190s systolic, afebrile.  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 Patient was transferred to [facility] for further neurology evaluation and stroke workup.  Going extensive stroke workup A1c 7.3 LDL 84, Lipitor high intensity added- Zocor discontinued, once echo resulted and is stable  Echo ordered with bubble studies       Subjective: Seen and examined in am Feeling fine no complaints Echo done report pending  BP poorly controlled. Start Amlodipine and prn hydralazine prn as BP in 200s. 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 BP goal : Permissive HTN upto 220/110 mmHg initially will aim for sbp <180  F/u Echocardiogram PENDING  Prophylactic therapy-ASA + Plavixx 3 WK then asa alone High intensity Statin switch zocor 40 to lipitor 8 as LDL > 70 HgbA1c 7.3-follow-up with PCP for better diabetes management diet controlled  DW with PT OT and speech need outpatient follow-up    CKD stage 3a: creat 1.3 > monitor labs creat slightly up at 1.5 encourage oral hydration avoid nephrotoxic medication Last Labs      Recent Labs  Lab 06/12/23 1327 06/14/23 0941  BUN 23 16  CREATININE 1.32* 1.59*       Hyperlipidemia: LDL not at goal 84, on Zocor 40 mg PTA.  Increasedstatin to Lipitor 80mg    Hypertension: On Avapro 30 mg PTA.Allowed permissive hypertension in the acute stroke setting  no will aim for sbp <180, amlodipine added continue Avapro continue PRN hydralazine    Type II diabetes mellitus with uncontrolled hyperglycemia: resume metformin upon discharge A1c 10.3 Last Labs           Recent Labs  Lab 06/13/23 0531 06/13/23 0739 06/13/23 1135 06/13/23 1658 06/13/23 2226 06/14/23 0605 06/14/23 1159  GLUCAP  --    < >  111* 234* 148* 128* 221*  HGBA1C 7.3*  --   --   --   --   --   --    < > = values in this interval not displayed.      Psoriasis: On biologic, Altamease Oiler, every 28 days for this.   GERD History of gastric ulcer with  hemorrhage: Continue Protonix especially while on antiplatelets.  Hemoglobin is stable   DVT prophylaxis: enoxaparin (LOVENOX) injection 40 mg Start: 06/13/23 1000 Code Status:   Code Status: Full Code Family Communication: plan of care discussed with patient/ at bedside. Patient status is:  INPATIENT because of STROKE Level of care: Telemetry Medical   Dispo: The patient is from: home            Anticipated disposition: home tomorrow Objective: Vitals last 24 hrs: Vitals:   06/14/23 2011 06/14/23 2012 06/14/23 2342   BP: (!) 180/64  (!) 145/54   Pulse: 65  63   Resp: 18  18   Temp:  (!) 97.5 F (36.4 C) 98.3 F (36.8 C)   TempSrc:  Oral Oral   SpO2: 100%  100% (  Weight:      Height:       Weight change:   Physical Examination: General exam: alert awake, older than stated age HEENT:Oral mucosa moist, Ear/Nose WNL grossly Respiratory system: bilaterally clear BS, no use of accessory muscle Cardiovascular system: S1 & S2 +, No JVD. Gastrointestinal system: Abdomen soft,NT,ND, BS+ Nervous System:Alert, awake, moving extremities. Extremities: LE edema NEG,distal peripheral pulses palpable.  Skin: No rashes,no icterus. MSK: Normal muscle bulk,tone, power  Medications reviewed:  Scheduled Meds:  amLODipine  5 mg Oral Daily   aspirin EC  81 mg Oral Daily   atorvastatin  80 mg Oral QPM   clopidogrel  75 mg Oral Daily   enoxaparin (LOVENOX) injection  40 mg Subcutaneous Q24H   insulin aspart  0-15 Units Subcutaneous TID WC   insulin aspart  0-5 Units Subcutaneous QHS   irbesartan  300 mg Oral QHS   pantoprazole  40 mg Oral Daily   Continuous Infusions:  sodium chloride 75 mL/hr at 06/14/23 1302      Diet Order             Diet Carb Modified Fluid consistency: Thin; Room service appropriate? Yes  Diet effective now                           No intake or output data in the 24 hours ending 06/15/23 0716 Net IO Since Admission: 1,480 mL [06/15/23 0716]   Wt Readings from Last 3 Encounters:  06/13/23 61.3 kg  12/22/16 70.3 kg  10/31/16 68.5 kg     Unresulted Labs (From admission, onward)    None     Data Reviewed: I have personally reviewed following labs and imaging studies CBC: Recent Labs  Lab 06/12/23 1327  WBC 5.4  HGB 12.5  HCT 35.9*  MCV 87.1  PLT 452*   Basic Metabolic Panel: Recent Labs  Lab 06/12/23 1327 06/14/23 0941  NA 134* 138  K 4.5 4.4  CL 105 105  CO2 21* 21*  GLUCOSE 318* 283*  BUN 23 16  CREATININE 1.32* 1.59*  CALCIUM 8.9 8.9   GFR: Estimated Creatinine Clearance: 29.2 mL/min (A) (by C-G formula based on SCr of 1.59 mg/dL (H)). Liver Function Tests: HbA1C: Recent Labs    06/13/23 0531  HGBA1C 7.3*  CBG: Recent Labs  Lab 06/14/23 0605 06/14/23 1159 06/14/23 1730 06/14/23 2103 06/15/23 0611  GLUCAP 128* 221* 133* 173* 145*   Lipid Profile: Recent Labs    06/13/23 0531  CHOL 154  HDL 51  LDLCALC 84  TRIG 97  CHOLHDL 3.0  No results found for this or any previous visit (from the past 240 hour(s)).  Antimicrobials: Anti-infectives (From admission, onward)    None      Culture/Microbiology No results found for: "SDES", "SPECREQUEST", "CULT", "REPTSTATUS"   Radiology Studies: ECHOCARDIOGRAM COMPLETE  Result Date: 06/14/2023    ECHOCARDIOGRAM REPORT   Patient Name:   KLAUDIA SHIPLETT Date of Exam: 06/14/2023 Medical Rec #:  [id number]    Height:       61.0 in Accession #:    [id number]   Weight:       135.1 lb Date of Birth:  January 01, 1956    BSA:          1.599 m Patient Age:    66 years     BP:           162/75 mmHg Patient Gender: F            HR:           57 bpm. Exam Location:  Inpatient Procedure: 2D Echo, Color Doppler and Cardiac Doppler Indications:    Stroke  History:        Patient has no prior history of Echocardiogram examinations. CKD                 and Stroke; Risk Factors:Dyslipidemia, Diabetes and                 Hypertension.  Sonographer:    Milbert Coulter Referring  Phys: 49 JARED M GARDNER IMPRESSIONS  1. Left ventricular ejection fraction, by estimation, is 60 to 65%. The left ventricle has normal function. The left ventricle has no regional wall motion abnormalities. Left ventricular diastolic parameters are consistent with Grade I diastolic dysfunction (impaired relaxation).  2. Right ventricular systolic function is normal. The right ventricular size is normal. There is normal pulmonary artery systolic pressure. The estimated right ventricular systolic pressure is 23.6 mmHg.  3. The mitral valve is grossly normal. Trivial mitral valve regurgitation. No evidence of mitral stenosis.  4. The aortic valve is tricuspid. Aortic valve regurgitation is not visualized. No aortic stenosis is present.  5. The inferior vena cava is normal in size with greater than 50% respiratory variability, suggesting right atrial pressure of 3 mmHg. Conclusion(s)/Recommendation(s): No intracardiac source of embolism detected on this transthoracic study. Consider a transesophageal echocardiogram to exclude cardiac source of embolism if clinically indicated. FINDINGS  Left Ventricle: Left ventricular ejection fraction, by estimation, is 60 to 65%. The left ventricle has normal function. The left ventricle has no regional wall motion abnormalities. The left ventricular internal cavity size was normal in size. There is  no left ventricular hypertrophy. Left ventricular diastolic parameters are consistent with Grade I diastolic dysfunction (impaired relaxation). Right Ventricle: The right ventricular size is normal. No increase in right ventricular wall thickness. Right ventricular systolic function is normal. There is normal pulmonary artery systolic pressure. The tricuspid regurgitant velocity is 2.27 m/s, and  with an assumed right atrial pressure of 3 mmHg, the estimated right ventricular systolic pressure is 23.6 mmHg. Left Atrium: Left atrial size was normal in size. Right Atrium: Right atrial  size was normal in size. Pericardium: There is no evidence of  pericardial effusion. Mitral Valve: The mitral valve is grossly normal. Trivial mitral valve regurgitation. No evidence of mitral valve stenosis. Tricuspid Valve: The tricuspid valve is grossly normal. Tricuspid valve regurgitation is trivial. No evidence of tricuspid stenosis. Aortic Valve: The aortic valve is tricuspid. Aortic valve regurgitation is not visualized. No aortic stenosis is present. Aortic valve mean gradient measures 4.0 mmHg. Aortic valve peak gradient measures 7.6 mmHg. Pulmonic Valve: The pulmonic valve was grossly normal. Pulmonic valve regurgitation is not visualized. No evidence of pulmonic stenosis. Aorta: The aortic root is normal in size and structure. Venous: The inferior vena cava is normal in size with greater than 50% respiratory variability, suggesting right atrial pressure of 3 mmHg. IAS/Shunts: The atrial septum is grossly normal.  LEFT VENTRICLE PLAX 2D LVIDd:         4.50 cm Diastology LVIDs:         2.60 cm LV e' medial:    6.74 cm/s LV PW:         1.00 cm LV E/e' medial:  10.5 LV IVS:        1.10 cm LV e' lateral:   10.90 cm/s                        LV E/e' lateral: 6.5  RIGHT VENTRICLE RV Basal diam:  2.50 cm RV Mid diam:    1.70 cm RV S prime:     12.00 cm/s TAPSE (M-mode): 1.9 cm LEFT ATRIUM             Index        RIGHT ATRIUM           Index LA diam:        3.00 cm 1.88 cm/m   RA Area:     10.50 cm LA Vol (A2C):   45.5 ml 28.46 ml/m  RA Volume:   20.00 ml  12.51 ml/m LA Vol (A4C):   42.6 ml 26.64 ml/m LA Biplane Vol: 44.1 ml 27.58 ml/m  AORTIC VALVE AV Vmax:           138.00 cm/s AV Vmean:          91.700 cm/s AV VTI:            0.292 m AV Peak Grad:      7.6 mmHg AV Mean Grad:      4.0 mmHg LVOT Vmax:         83.90 cm/s LVOT Vmean:        53.900 cm/s LVOT VTI:          0.189 m LVOT/AV VTI ratio: 0.65  AORTA Ao Root diam: 2.80 cm MITRAL VALVE                TRICUSPID VALVE MV Area (PHT): 2.55 cm     TR Peak  grad:   20.6 mmHg MV Decel Time: 298 msec     TR Vmax:        227.00 cm/s MV E velocity: 70.90 cm/s MV A velocity: 123.00 cm/s  SHUNTS MV E/A ratio:  0.58         Systemic VTI: 0.19 m Lennie Odor MD Electronically signed by Lennie Odor MD Signature Date/Time: 06/14/2023/8:58:03 PM    Final      LOS: 1 day   Lanae Boast, MD [facility]  06/15/2023, 7:16 AM

## 2023-06-14 NOTE — Inpatient Diabetes Management (Signed)
Inpatient Diabetes Program Recommendations  AACE/ADA: New Consensus Statement on Inpatient Glycemic Control (2015)  Target Ranges:  Prepandial:   less than 140 mg/dL      Peak postprandial:   less than 180 mg/dL (1-2 hours)      Critically ill patients:  140 - 180 mg/dL   Lab Results  Component Value Date   GLUCAP 221 (H) 06/14/2023   HGBA1C 7.3 (H) 06/13/2023    Review of Glycemic Control  Latest Reference Range & Units 06/13/23 16:58 06/13/23 22:26 06/14/23 06:05 06/14/23 11:59  Glucose-Capillary 70 - 99 mg/dL 952 (H) 841 (H) 324 (H) 221 (H)   Diabetes history: DM 2 Outpatient Diabetes medications:  Metformin 500 mg daily Current orders for Inpatient glycemic control:  Novolog 0-15 units tid with meals and HS  Inpatient Diabetes Program Recommendations:    Please consider adding Novolog meal coverage 3 units tid with meals (hold if patient eats less than 50% or NPO).   Thanks,  Beryl Meager, RN, BC-ADM Inpatient Diabetes Coordinator Pager [phone]  (8a-5p)

## 2023-06-14 NOTE — Discharge Summary (Signed)
Physician Discharge Summary  Kristi Avery WJX:[id number] DOB: Feb 11, 1956 DOA: 06/12/2023  PCP: [name], MD  Admit date: 06/12/2023 Discharge date: 06/15/2023 Recommendations for Outpatient Follow-up:  Follow up with PCP in 1 weeks-call for appointment Monitor blood pressure at home and notify MD PCP if poorly controlled Please obtain BMP/CBC in one week  Discharge Dispo:home Discharge Condition: Stable Code Status:   Code Status: Full Code Diet recommendation:  Diet Order             Diet Carb Modified Fluid consistency: Thin; Room service appropriate? Yes  Diet effective now                    Brief/Interim Summary: 67 y.o.f w/ HTN, DM2, HLD, chronic anemia, history of gastric ulcer with hemorrhage/GERD, insomnia, psoriasis, arthritis who developed intermittent dizziness 8/18 morning and daughter noticed she had some slurred speech and seen in the ED 8/19. Her dizziness worse with standing and with movement.  No weakness in arms or legs, no visual disturbance. In the ED hypertensive in 170s to 190s systolic, afebrile.  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 Patient was transferred to [facility] for further neurology evaluation and stroke workup.  Going extensive stroke workup A1c 7.3 LDL 84, Lipitor high intensity added- Zocor discontinued, once echo resulted and is stable  Echo ordered with bubble studies > resulted > lvef 60 to 65%. The left ventricle has  normal function. The left ventricle has no regional wall motion abnormalities. Left ventricular diastolic parameters are consistent with Grade I diastolic dysfunction No intracardiac source of embolism detected on this transthoracic study.she can be discharged home will go on aspirin 81 Plavix 75 x 3-week afterwards aspirin alone and outpatient PT OT.  Symptoms at this time has resolved.  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 BP goal : Permissive HTN upto 220/110 mmHg  F/u Echocardiogram unremarkable lvef 60-65% Prophylactic therapy-ASA + Plavix 3 WK then asa alone High intensity Statin switch zocor 40 to lipitor 67 as LDL > 70 HgbA1c 7.3-follow-up with PCP for better diabetes management diet controlled  DW with PT OT and speech need outpatient follow-up  CKD stage 3a: creat 1.3 > monitor labs creat slightly up at 1.5 encourage oral hydration avoid nephrotoxic medication Recent Labs  Lab 06/12/23 1327 06/14/23 0941 06/15/23 0724   BUN 23 16 18   CREATININE 1.32* 1.59* 1.54*     Hyperlipidemia: LDL not at goal 84, on Zocor 40 mg PTA.  Increasedstatin to Lipitor 80mg    Hypertension: Amlodipine added 8/21 continued her ARBs  slowly reduce bp to normal range in next 2-3 days-on discharge blood pressure was 156 systolic.  I discussed Dr. Roda Shutters from neurology I again called and updated the patient's husband on phone to monitor blood pressure at home and follow-up with PCP within few more days   Type II diabetes mellitus with uncontrolled hyperglycemia: resume metformin upon discharge A1c 10.3 Recent Labs  Lab 06/13/23 0531 06/13/23 0739 06/14/23 0605 06/14/23 1159 06/14/23 1730 06/14/23 2103 06/15/23 0611  GLUCAP  --    < > 128* 221* 133* 173* 145*  HGBA1C 7.3*  --   --   --   --   --   --    < > = values in this interval not displayed.    Psoriasis: On biologic, Altamease Oiler, every 28 days for this.  GERD History of gastric ulcer with hemorrhage: Continue Protonix especially while on antiplatelets.  Hemoglobin is stable    Consults: Neurology Subjective: Alert awake oriented resting comfortably  Discharge Exam: Vitals:   06/15/23 1012 06/15/23 1023  BP: (!) 170/68 (!) 157/65  Pulse: 66 64  Resp:    Temp:    SpO2:     General: Pt is alert, awake, not in acute distress Cardiovascular: RRR, S1/S2 +, no rubs, no gallops Respiratory: CTA bilaterally, no wheezing, no rhonchi Abdominal: Soft, NT, ND, bowel sounds + Extremities: no edema, no cyanosis  Discharge Instructions  Discharge Instructions     Ambulatory referral to Neurology   Complete by: As directed    Follow up with stroke clinic NP at [facility] in about 4 weeks. Thanks.   Ambulatory referral to Physical Therapy   Complete by: As directed    Discharge instructions   Complete by: As directed    Please call call MD or return to ER for similar or worsening recurring problem that brought you to hospital or if any fever,nausea/vomiting,abdominal pain,  uncontrolled pain, chest pain,  shortness of breath or any other alarming symptoms.  You will need repeat to follow-up on your kidney function within a week  Please follow-up your doctor as instructed in a week time and call the office for appointment.  Please avoid alcohol, smoking, or any other illicit substance and maintain healthy habits including taking your regular medications as prescribed.  You were cared for by a hospitalist during your hospital stay. If you have any questions about your discharge medications or the care you received while you were in the hospital after you are discharged, you can call the unit and ask to speak with the hospitalist on call if the hospitalist that took care of you is not available.  Once you are discharged, your primary care physician will handle any further medical issues. Please note that NO REFILLS for any discharge medications will be authorized once you are discharged, as it is imperative that you return to your primary care physician (or establish a relationship with a primary care physician if you do not have one) for your aftercare needs so that they can reassess your need for medications and monitor your lab values   Increase  activity slowly   Complete by: As directed       Allergies as of 06/15/2023       Reactions   Tramadol Nausea And Vomiting, Other (See Comments)   GI Intolerance   Sulfonamide Derivatives Nausea And Vomiting        Medication List     STOP taking these medications    Advil 200 MG Caps Generic drug: Ibuprofen   omeprazole 20 MG tablet Commonly known as: PRILOSEC OTC   simvastatin 40 MG tablet Commonly known as: ZOCOR       TAKE these medications    alendronate 70 MG tablet Commonly known as: FOSAMAX Take 70 mg by mouth every Sunday. Take with a full glass of water on an empty stomach.   amLODipine 5 MG tablet Commonly known as: NORVASC Take 1 tablet (5 mg total) by mouth daily. Start taking on:  June 16, 2023   aspirin EC 81 MG tablet Take 1 tablet (81 mg total) by mouth daily. Swallow whole.   atorvastatin 80 MG tablet Commonly known as: LIPITOR Take 1 tablet (80 mg total) by mouth every evening.   clopidogrel 75 MG tablet Commonly known as: PLAVIX Take 1 tablet (75 mg total) by mouth daily for 21 days.   cyclobenzaprine 5 MG tablet Commonly known as: FLEXERIL Take 5 mg by mouth 3 (three) times daily as needed for muscle spasms.   irbesartan 300 MG tablet Commonly known as: AVAPRO Take 300 mg by mouth daily.   metFORMIN 1000 MG tablet Commonly known as: GLUCOPHAGE Take 500 mg by mouth daily with breakfast.   pantoprazole 40 MG tablet Commonly known as: PROTONIX Take 1 tablet (40 mg total) by mouth daily. (Take this in place of omeprazole while on clopidogrel)   Taltz 80 MG/ML Soaj Generic drug: Ixekizumab Inject 80 mg into the skin every 28 (twenty-eight) days.   VITAMIN B12 PO Take 1 tablet by mouth daily.   VITAMIN D3 PO Take 1 capsule by mouth daily.         Follow-up Information     [facility] [place]. Schedule an appointment as soon as possible for a visit.   Specialty: Rehabilitation Contact information: [address] [place] [address] [phone]        Russo, John, MD Follow up in 1 week(s).   Specialty: Internal Medicine Contact information: [address] [place] [address] [phone]         [facility]. Schedule an appointment as soon as possible for a visit in 1 month(s).   Specialty: Neurology Why: stroke clinic Contact information: [address] [place] [address] [phone]               Allergies  Allergen Reactions   Tramadol Nausea And Vomiting and Other (See Comments)    GI Intolerance   Sulfonamide Derivatives Nausea And Vomiting    The results of significant diagnostics from  this hospitalization (including imaging, microbiology, ancillary and laboratory) are listed below for reference.    Microbiology: No results found for this or any previous visit (from the past 240 hour(s)).  Procedures/Studies: ECHOCARDIOGRAM COMPLETE  Result Date: 06/14/2023    ECHOCARDIOGRAM REPORT   Patient Name:   Kristi Avery Date of Exam: 06/14/2023 Medical Rec #:  [id number]    Height:       61 .0 in Accession #:    [id number]   Weight:  135.1 lb Date of Birth:  1956-02-28    BSA:          1.599 m Patient Age:    66 years     BP:           162/75 mmHg Patient Gender: F            HR:           57 bpm. Exam Location:  Inpatient Procedure: 2D Echo, Color Doppler and Cardiac Doppler Indications:    Stroke  History:        Patient has no prior history of Echocardiogram examinations. CKD                 and Stroke; Risk Factors:Dyslipidemia, Diabetes and                 Hypertension.  Sonographer:    Milbert Coulter Referring Phys: [id number] JARED M GARDNER IMPRESSIONS  1. Left ventricular ejection fraction, by estimation, is 60 to 65%. The left ventricle has normal function. The left ventricle has no regional wall motion abnormalities. Left ventricular diastolic parameters are consistent with Grade I diastolic dysfunction (impaired relaxation).  2. Right ventricular systolic function is normal. The right ventricular size is normal. There is normal pulmonary artery systolic pressure. The estimated right ventricular systolic pressure is 23.6 mmHg.  3. The mitral valve is grossly normal. Trivial mitral valve regurgitation. No evidence of mitral stenosis.  4. The aortic valve is tricuspid. Aortic valve regurgitation is not visualized. No aortic stenosis is present.  5. The inferior vena cava is normal in size with greater than 50% respiratory variability, suggesting right atrial pressure of 3 mmHg. Conclusion(s)/Recommendation(s): No intracardiac source of embolism detected on this transthoracic study. Consider a  transesophageal echocardiogram to exclude cardiac source of embolism if clinically indicated. FINDINGS  Left Ventricle: Left ventricular ejection fraction, by estimation, is 60 to 65%. The left ventricle has normal function. The left ventricle has no regional wall motion abnormalities. The left ventricular internal cavity size was normal in size. There is  no left ventricular hypertrophy. Left ventricular diastolic parameters are consistent with Grade I diastolic dysfunction (impaired relaxation). Right Ventricle: The right ventricular size is normal. No increase in right ventricular wall thickness. Right ventricular systolic function is normal. There is normal pulmonary artery systolic pressure. The tricuspid regurgitant velocity is 2.27 m/s, and  with an assumed right atrial pressure of 3 mmHg, the estimated right ventricular systolic pressure is 23.6 mmHg. Left Atrium: Left atrial size was normal in size. Right Atrium: Right atrial size was normal in size. Pericardium: There is no evidence of pericardial effusion. Mitral Valve: The mitral valve is grossly normal. Trivial mitral valve regurgitation. No evidence of mitral valve stenosis. Tricuspid Valve: The tricuspid valve is grossly normal. Tricuspid valve regurgitation is trivial. No evidence of tricuspid stenosis. Aortic Valve: The aortic valve is tricuspid. Aortic valve regurgitation is not visualized. No aortic stenosis is present. Aortic valve mean gradient measures 4.0 mmHg. Aortic valve peak gradient measures 7.6 mmHg. Pulmonic Valve: The pulmonic valve was grossly normal. Pulmonic valve regurgitation is not visualized. No evidence of pulmonic stenosis. Aorta: The aortic root is normal in size and structure. Venous: The inferior vena cava is normal in size with greater than 50% respiratory variability, suggesting right atrial pressure of 3 mmHg. IAS/Shunts: The atrial septum is grossly normal.  LEFT VENTRICLE PLAX 2D LVIDd:         4.50 cm Diastology  LVIDs:  2.60 cm LV e' medial:    6.74 cm/s LV PW:         1.00 cm LV E/e' medial:  10.5 LV IVS:        1.10 cm LV e' lateral:   10.90 cm/s                        LV E/e' lateral: 6.5  RIGHT VENTRICLE RV Basal diam:  2.50 cm RV Mid diam:    1.70 cm RV S prime:     12.00 cm/s TAPSE (M-mode): 1.9 cm LEFT ATRIUM             Index        RIGHT ATRIUM           Index LA diam:        3.00 cm 1.88 cm/m   RA Area:     10.50 cm LA Vol (A2C):   45.5 ml 28.46 ml/m  RA Volume:   20.00 ml  12.51 ml/m LA Vol (A4C):   42.6 ml 26.64 ml/m LA Biplane Vol: 44.1 ml 27.58 ml/m  AORTIC VALVE AV Vmax:           138.00 cm/s AV Vmean:          91.700 cm/s AV VTI:            0.292 m AV Peak Grad:      7.6 mmHg AV Mean Grad:      4.0 mmHg LVOT Vmax:         83.90 cm/s LVOT Vmean:        53.900 cm/s LVOT VTI:          0.189 m LVOT/AV VTI ratio: 0.65  AORTA Ao Root diam: 2.80 cm MITRAL VALVE                TRICUSPID VALVE MV Area (PHT): 2.55 cm     TR Peak grad:   20.6 mmHg MV Decel Time: 298 msec     TR Vmax:        227.00 cm/s MV E velocity: 70.90 cm/s MV A velocity: 123.00 cm/s  SHUNTS MV E/A ratio:  0.58         Systemic VTI: 0.19 m Lennie Odor MD Electronically signed by Lennie Odor MD Signature Date/Time: 06/14/2023/8:58:03 PM    Final    CT ANGIO HEAD NECK W WO CM  Result Date: 06/12/2023 CLINICAL DATA:  Dizzy, slurred speech, headache, acute pontine infarct on MRI EXAM: CT ANGIOGRAPHY HEAD AND NECK WITH AND WITHOUT CONTRAST TECHNIQUE: Multidetector CT imaging of the head and neck was performed using the standard protocol during bolus administration of intravenous contrast. Multiplanar CT image reconstructions and MIPs were obtained to evaluate the vascular anatomy. Carotid stenosis measurements (when applicable) are obtained utilizing NASCET criteria, using the distal internal carotid diameter as the denominator. RADIATION DOSE REDUCTION: This exam was performed according to the departmental dose-optimization program  which includes automated exposure control, adjustment of the mA and/or kV according to patient size and/or use of iterative reconstruction technique. CONTRAST:  75mL OMNIPAQUE IOHEXOL 350 MG/ML SOLN COMPARISON:  No prior CTA available, correlation is made with 06/12/2023 CT head and MRI head FINDINGS: CT HEAD FINDINGS Brain: Evaluation of the pons is somewhat limited by artifact related to patient's calvarium. This includes the area of acute infarct noted on the same-day MRI. Redemonstrated more remote left lateral pontine infarct. No other acute infarct. No acute hemorrhage,  mass, mass effect, or midline shift. No hydrocephalus or extra-axial fluid collection. Vascular: No hyperdense vessel. Skull: Negative for fracture or focal lesion. Sinuses/Orbits: No acute finding. Other: The mastoid air cells are well aerated. CTA NECK FINDINGS Aortic arch: Standard branching. Imaged portion shows no evidence of aneurysm or dissection. No significant stenosis of the major arch vessel origins. Aortic atherosclerosis. Right carotid system: No evidence of dissection, occlusion, or hemodynamically significant stenosis (greater than 50%). Atherosclerotic disease at the bifurcation and in the proximal ICA is not hemodynamically significant. Left carotid system: No evidence of dissection, occlusion, or hemodynamically significant stenosis (greater than 50%). Atherosclerotic disease at the bifurcation and in the proximal ICA is not hemodynamically significant. Vertebral arteries: Severe stenosis at the origin of the left vertebral artery. Scattered calcifications in the left V2 segment, without significant stenosis. The right vertebral artery is patent from its origin to the skull base without significant stenosis. Skeleton: No acute osseous abnormality. Degenerative changes in the cervical spine. Other neck: 1.2 cm hypoenhancing nodule in the right thyroid lobe, for which no follow-up is currently indicated. Upper chest: No focal  pulmonary opacity or pleural effusion. Mild emphysema. Review of the MIP images confirms the above findings CTA HEAD FINDINGS Anterior circulation: Both internal carotid arteries are patent to the termini, with severe stenosis in the left cavernous segment and left supraclinoid segment, and moderate stenosis in the right cavernous segment and right supraclinoid segment. Patent right A1. Aplastic left A1. Normal anterior communicating artery. Anterior cerebral arteries are patent to their distal aspects without significant stenosis. No M1 stenosis or occlusion. MCA branches perfused to their distal aspects without significant stenosis. Posterior circulation: Vertebral arteries patent to the vertebrobasilar junction without significant stenosis. Posterior inferior cerebellar arteries patent proximally. Basilar patent to its distal aspect without significant stenosis. Superior cerebellar arteries patent proximally. Patent P1 segments. PCAs perfused to their distal aspects without significant stenosis. The left posterior communicating artery is diminutive but patent. Venous sinuses: As permitted by contrast timing, patent. Anatomic variants: None significant. Review of the MIP images confirms the above findings IMPRESSION: 1. 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 2. 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 No other hemodynamically significant stenosis in the neck. 3. No intracranial large vessel occlusion. 4. Aortic atherosclerosis. Aortic Atherosclerosis (ICD10-I70.0). Electronically Signed   By: Wiliam Ke M.D.   On: 06/12/2023 21:39   MR BRAIN WO CONTRAST  Result Date: 06/12/2023 CLINICAL DATA:  Neuro deficit, acute, stroke suspected. Patient began feeling dizzy with slurred speech yesterday at 12 noon. Persistent headache. EXAM: MRI HEAD WITHOUT CONTRAST TECHNIQUE: Multiplanar, multiecho  pulse sequences of the brain and surrounding structures were obtained without intravenous contrast. COMPARISON:  CT head without contrast 06/12/2023 FINDINGS: Brain: Acute nonhemorrhagic left paramedian pontine infarct is present. Infarct measures up to 15 mm anterior to posterior and 6 mm cephalo caudad. Subtle T2 and FLAIR hyperintensity is associated. A more remote lateral pontine infarct corresponds to the finding at CT. No other acute infarct is present. Remote linear infarct is present in the lateral right thalamus. Deep brain nuclei are within normal limits. No significant white matter lesions are present. The ventricles are of normal size. No significant extraaxial fluid collection is present. The more inferior brainstem is within normal limits. Cerebellar hemispheres are normal bilaterally. Mm cephalo caudad. Vascular: Flow is present in the major intracranial arteries. Skull and upper cervical  spine: The craniocervical junction is normal. Upper cervical spine is within normal limits. Chronic degenerative changes are present at C3-4 with a broad-based disc osteophyte complex resulting in central canal stenosis. Chronic loss of vertebral body height is present at both C3 and C4. Craniocervical junction is normal. The sella is somewhat expanded. Pituitary is unremarkable. Marrow signal is otherwise normal. Sinuses/Orbits: The paranasal sinuses and mastoid air cells are clear. The globes and orbits are within normal limits. IMPRESSION: 1. Acute nonhemorrhagic left paramedian pontine infarct. 2. More remote lateral pontine infarct corresponds to the finding at CT. 3. Remote linear infarct of the lateral right thalamus. 4. Chronic degenerative changes of the cervical spine as described. Electronically Signed   By: Marin Roberts M.D.   On: 06/12/2023 19:56   CT Head Wo Contrast  Result Date: 06/12/2023 CLINICAL DATA:  Neuro deficit, acute, stroke suspected. EXAM: CT HEAD WITHOUT CONTRAST TECHNIQUE:  Contiguous axial images were obtained from the base of the skull through the vertex without intravenous contrast. RADIATION DOSE REDUCTION: This exam was performed according to the departmental dose-optimization program which includes automated exposure control, adjustment of the mA and/or kV according to patient size and/or use of iterative reconstruction technique. COMPARISON:  None Available. FINDINGS: Brain: Age indeterminate small left pontine infarct. No evidence of acute hemorrhage, hydrocephalus, extra-axial collection or mass lesion/mass effect. Vascular: No hyperdense vessel identified. Skull: No acute fracture. Sinuses/Orbits: Clear sinuses.  No acute findings. Other: No mastoid effusions. IMPRESSION: Age indeterminate small left pontine infarct.  Recommend MRI. Electronically Signed   By: Feliberto Harts M.D.   On: 06/12/2023 16:21    Labs: BNP (last 3 results) No results for input(s): "BNP" in the last 8760 hours. Basic Metabolic Panel: Recent Labs  Lab 06/12/23 1327 06/14/23 0941 06/15/23 0724  NA 134* 138 137  K 4.5 4.4 4.0  CL 105 105 112*  CO2 21* 21* 19*  GLUCOSE 318* 283* 142*  BUN 23 16 18   CREATININE 1.32* 1.59* 1.54*  CALCIUM 8.9 8.9 8.5*   Liver Function Tests: No results for input(s): "AST", "ALT", "ALKPHOS", "BILITOT", "PROT", "ALBUMIN" in the last 168 hours. No results for input(s): "LIPASE", "AMYLASE" in the last 168 hours. No results for input(s): "AMMONIA" in the last 168 hours. CBC: Recent Labs  Lab 06/12/23 1327  WBC 5.4  HGB 12.5  HCT 35.9*  MCV 87.1  PLT 452*   Cardiac Enzymes: No results for input(s): "CKTOTAL", "CKMB", "CKMBINDEX", "TROPONINI" in the last 168 hours. BNP: Invalid input(s): "POCBNP" CBG: Recent Labs  Lab 06/14/23 0605 06/14/23 1159 06/14/23 1730 06/14/23 2103 06/15/23 0611  GLUCAP 128* 221* 133* 173* 145*   D-Dimer No results for input(s): "DDIMER" in the last 72 hours. Hgb A1c Recent Labs    06/13/23 0531   HGBA1C 7.3*   Lipid Profile Recent Labs    06/13/23 0531  CHOL 154  HDL 51  LDLCALC 84  TRIG 97  CHOLHDL 3.0   Thyroid function studies No results for input(s): "TSH", "T4TOTAL", "T3FREE", "THYROIDAB" in the last 72 hours.  Invalid input(s): "FREET3" Anemia work up No results for input(s): "VITAMINB12", "FOLATE", "FERRITIN", "TIBC", "IRON", "RETICCTPCT" in the last 72 hours. Urinalysis    Component Value Date/Time   COLORURINE STRAW (A) 06/12/2023 1726   APPEARANCEUR CLEAR 06/12/2023 1726   LABSPEC 1.006 06/12/2023 1726   PHURINE 6.0 06/12/2023 1726   GLUCOSEU 150 (A) 06/12/2023 1726   HGBUR NEGATIVE 06/12/2023 1726   BILIRUBINUR NEGATIVE 06/12/2023 1726   BILIRUBINUR negative  10/31/2016 1031   KETONESUR NEGATIVE 06/12/2023 1726   PROTEINUR 30 (A) 06/12/2023 1726   UROBILINOGEN 0.2 10/31/2016 1031   UROBILINOGEN 0.2 09/01/2012 0142   NITRITE NEGATIVE 06/12/2023 1726   LEUKOCYTESUR NEGATIVE 06/12/2023 1726   Sepsis Labs Recent Labs  Lab 06/12/23 1327  WBC 5.4   Microbiology No results found for this or any previous visit (from the past 240 hour(s)).  Time coordinating discharge: 35 minutes  SIGNED: Lanae Boast, MD  [facility] 06/15/2023, 10:59 AM  If 7PM-7AM, please contact night-coverage [email or web address]

## 2023-06-14 NOTE — Progress Notes (Signed)
STROKE TEAM PROGRESS NOTE   SUBJECTIVE (INTERVAL HISTORY) Her husband is at the bedside.  Overall her condition is gradually improving.  Patient right facial droop and slurred speech has improved.  PT and OT recommend outpatient PT.  Pending 2D echo.   OBJECTIVE Temp:  [97.9 F (36.6 C)-98.6 F (37 C)] 97.9 F (36.6 C) (08/21 1159) Pulse Rate:  [57-72] 72 (08/21 1842) Cardiac Rhythm: Normal sinus rhythm (08/21 0743) Resp:  [15-18] 17 (08/21 1735) BP: (156-213)/(60-106) 176/72 (08/21 1842) SpO2:  [100 %] 100 % (08/21 1735)  Recent Labs  Lab 06/13/23 1658 06/13/23 2226 06/14/23 0605 06/14/23 1159 06/14/23 1730  GLUCAP 234* 148* 128* 221* 133*   Recent Labs  Lab 06/12/23 1327 06/14/23 0941  NA 134* 138  K 4.5 4.4  CL 105 105  CO2 21* 21*  GLUCOSE 318* 283*  BUN 23 16  CREATININE 1.32* 1.59*  CALCIUM 8.9 8.9   No results for input(s): "AST", "ALT", "ALKPHOS", "BILITOT", "PROT", "ALBUMIN" in the last 168 hours. Recent Labs  Lab 06/12/23 1327  WBC 5.4  HGB 12.5  HCT 35.9*  MCV 87.1  PLT 452*   No results for input(s): "CKTOTAL", "CKMB", "CKMBINDEX", "TROPONINI" in the last 168 hours. No results for input(s): "LABPROT", "INR" in the last 72 hours. Recent Labs    06/12/23 1726  COLORURINE STRAW*  LABSPEC 1.006  PHURINE 6.0  GLUCOSEU 150*  HGBUR NEGATIVE  BILIRUBINUR NEGATIVE  KETONESUR NEGATIVE  PROTEINUR 30*  NITRITE NEGATIVE  LEUKOCYTESUR NEGATIVE       Component Value Date/Time   CHOL 154 06/13/2023 0531   TRIG 97 06/13/2023 0531   HDL 51 06/13/2023 0531   CHOLHDL 3.0 06/13/2023 0531   VLDL 19 06/13/2023 0531   LDLCALC 84 06/13/2023 0531   Lab Results  Component Value Date   HGBA1C 7.3 (H) 06/13/2023      Component Value Date/Time   LABOPIA NONE DETECTED 06/12/2023 2154   COCAINSCRNUR NONE DETECTED 06/12/2023 2154   LABBENZ NONE DETECTED 06/12/2023 2154   AMPHETMU NONE DETECTED 06/12/2023 2154   THCU POSITIVE (A) 06/12/2023 2154    LABBARB NONE DETECTED 06/12/2023 2154    No results for input(s): "ETH" in the last 168 hours.  I have personally reviewed the radiological images below and agree with the radiology interpretations.  CT ANGIO HEAD NECK W WO CM  Result Date: 06/12/2023 CLINICAL DATA:  Dizzy, slurred speech, headache, acute pontine infarct on MRI EXAM: CT ANGIOGRAPHY HEAD AND NECK WITH AND WITHOUT CONTRAST TECHNIQUE: Multidetector CT imaging of the head and neck was performed using the standard protocol during bolus administration of intravenous contrast. Multiplanar CT image reconstructions and MIPs were obtained to evaluate the vascular anatomy. Carotid stenosis measurements (when applicable) are obtained utilizing NASCET criteria, using the distal internal carotid diameter as the denominator. RADIATION DOSE REDUCTION: This exam was performed according to the departmental dose-optimization program which includes automated exposure control, adjustment of the mA and/or kV according to patient size and/or use of iterative reconstruction technique. CONTRAST:  75mL OMNIPAQUE IOHEXOL 350 MG/ML SOLN COMPARISON:  No prior CTA available, correlation is made with 06/12/2023 CT head and MRI head FINDINGS: CT HEAD FINDINGS Brain: Evaluation of the pons is somewhat limited by artifact related to patient's calvarium. This includes the area of acute infarct noted on the same-day MRI. Redemonstrated more remote left lateral pontine infarct. No other acute infarct. No acute hemorrhage, mass, mass effect, or midline shift. No hydrocephalus or extra-axial fluid collection. Vascular: No  hyperdense vessel. Skull: Negative for fracture or focal lesion. Sinuses/Orbits: No acute finding. Other: The mastoid air cells are well aerated. CTA NECK FINDINGS Aortic arch: Standard branching. Imaged portion shows no evidence of aneurysm or dissection. No significant stenosis of the major arch vessel origins. Aortic atherosclerosis. Right carotid system: No  evidence of dissection, occlusion, or hemodynamically significant stenosis (greater than 50%). Atherosclerotic disease at the bifurcation and in the proximal ICA is not hemodynamically significant. Left carotid system: No evidence of dissection, occlusion, or hemodynamically significant stenosis (greater than 50%). Atherosclerotic disease at the bifurcation and in the proximal ICA is not hemodynamically significant. Vertebral arteries: Severe stenosis at the origin of the left vertebral artery. Scattered calcifications in the left V2 segment, without significant stenosis. The right vertebral artery is patent from its origin to the skull base without significant stenosis. Skeleton: No acute osseous abnormality. Degenerative changes in the cervical spine. Other neck: 1.2 cm hypoenhancing nodule in the right thyroid lobe, for which no follow-up is currently indicated. Upper chest: No focal pulmonary opacity or pleural effusion. Mild emphysema. Review of the MIP images confirms the above findings CTA HEAD FINDINGS Anterior circulation: Both internal carotid arteries are patent to the termini, with severe stenosis in the left cavernous segment and left supraclinoid segment, and moderate stenosis in the right cavernous segment and right supraclinoid segment. Patent right A1. Aplastic left A1. Normal anterior communicating artery. Anterior cerebral arteries are patent to their distal aspects without significant stenosis. No M1 stenosis or occlusion. MCA branches perfused to their distal aspects without significant stenosis. Posterior circulation: Vertebral arteries patent to the vertebrobasilar junction without significant stenosis. Posterior inferior cerebellar arteries patent proximally. Basilar patent to its distal aspect without significant stenosis. Superior cerebellar arteries patent proximally. Patent P1 segments. PCAs perfused to their distal aspects without significant stenosis. The left posterior communicating  artery is diminutive but patent. Venous sinuses: As permitted by contrast timing, patent. Anatomic variants: None significant. Review of the MIP images confirms the above findings IMPRESSION: 1. 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 2. 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 No other hemodynamically significant stenosis in the neck. 3. No intracranial large vessel occlusion. 4. Aortic atherosclerosis. Aortic Atherosclerosis (ICD10-I70.0). Electronically Signed   By: Wiliam Ke M.D.   On: 06/12/2023 21:39   MR BRAIN WO CONTRAST  Result Date: 06/12/2023 CLINICAL DATA:  Neuro deficit, acute, stroke suspected. Patient began feeling dizzy with slurred speech yesterday at 12 noon. Persistent headache. EXAM: MRI HEAD WITHOUT CONTRAST TECHNIQUE: Multiplanar, multiecho pulse sequences of the brain and surrounding structures were obtained without intravenous contrast. COMPARISON:  CT head without contrast 06/12/2023 FINDINGS: Brain: Acute nonhemorrhagic left paramedian pontine infarct is present. Infarct measures up to 15 mm anterior to posterior and 6 mm cephalo caudad. Subtle T2 and FLAIR hyperintensity is associated. A more remote lateral pontine infarct corresponds to the finding at CT. No other acute infarct is present. Remote linear infarct is present in the lateral right thalamus. Deep brain nuclei are within normal limits. No significant white matter lesions are present. The ventricles are of normal size. No significant extraaxial fluid collection is present. The more inferior brainstem is within normal limits. Cerebellar hemispheres are normal bilaterally. Mm cephalo caudad. Vascular: Flow is present in the major intracranial arteries. Skull and upper cervical spine: The craniocervical junction is normal. Upper cervical spine is within normal limits. Chronic  degenerative changes are present  at C3-4 with a broad-based disc osteophyte complex resulting in central canal stenosis. Chronic loss of vertebral body height is present at both C3 and C4. Craniocervical junction is normal. The sella is somewhat expanded. Pituitary is unremarkable. Marrow signal is otherwise normal. Sinuses/Orbits: The paranasal sinuses and mastoid air cells are clear. The globes and orbits are within normal limits. IMPRESSION: 1. Acute nonhemorrhagic left paramedian pontine infarct. 2. More remote lateral pontine infarct corresponds to the finding at CT. 3. Remote linear infarct of the lateral right thalamus. 4. Chronic degenerative changes of the cervical spine as described. Electronically Signed   By: Marin Roberts M.D.   On: 06/12/2023 19:56   CT Head Wo Contrast  Result Date: 06/12/2023 CLINICAL DATA:  Neuro deficit, acute, stroke suspected. EXAM: CT HEAD WITHOUT CONTRAST TECHNIQUE: Contiguous axial images were obtained from the base of the skull through the vertex without intravenous contrast. RADIATION DOSE REDUCTION: This exam was performed according to the departmental dose-optimization program which includes automated exposure control, adjustment of the mA and/or kV according to patient size and/or use of iterative reconstruction technique. COMPARISON:  None Available. FINDINGS: Brain: Age indeterminate small left pontine infarct. No evidence of acute hemorrhage, hydrocephalus, extra-axial collection or mass lesion/mass effect. Vascular: No hyperdense vessel identified. Skull: No acute fracture. Sinuses/Orbits: Clear sinuses.  No acute findings. Other: No mastoid effusions. IMPRESSION: Age indeterminate small left pontine infarct.  Recommend MRI. Electronically Signed   By: Feliberto Harts M.D.   On: 06/12/2023 16:21     PHYSICAL EXAM  Temp:  [97.9 F (36.6 C)-98.6 F (37 C)] 97.9 F (36.6 C) (08/21 1159) Pulse Rate:  [57-72] 72 (08/21 1842) Resp:  [15-18] 17 (08/21 1735) BP: (156-213)/(60-106)  176/72 (08/21 1842) SpO2:  [100 %] 100 % (08/21 1735)  General - Well nourished, well developed, in no apparent distress.  Ophthalmologic - fundi not visualized due to noncooperation.  Cardiovascular - Regular rhythm and rate.  Mental Status -  Level of arousal and orientation to time, place, and person were intact.  Mild dysarthria. Language including expression, naming, repetition, comprehension was assessed and found intact. Fund of Knowledge was assessed and was intact.  Cranial Nerves II - XII - II - Visual field intact OU. III, IV, VI - Extraocular movements intact. V - Facial sensation intact bilaterally. VII - mild right facial droop VIII - Hearing & vestibular intact bilaterally. X - Palate elevates symmetrically. XI - Chin turning & shoulder shrug intact bilaterally. XII - Tongue protrusion intact.  Motor Strength - The patient's strength was normal in all extremities and pronator drift was absent.  Bulk was normal and fasciculations were absent.   Motor Tone - Muscle tone was assessed at the neck and appendages and was normal.  Reflexes - The patient's reflexes were symmetrical in all extremities and she had no pathological reflexes.  Sensory - Light touch, temperature/pinprick were assessed and were symmetrical.    Coordination - The patient had normal movements in the hands and feet with no ataxia or dysmetria.  Tremor was absent.  Gait and Station - deferred.   ASSESSMENT/PLAN Ms. Kristi Avery is a 67 y.o. female with history of hypertension, diabetes, hyperlipidemia admitted for sudden onset dizziness and slurred speech. No tPA given due to outside window.    Stroke: Left pontine infarct, etiology: Mild vessel disease Code Stroke CT head- Age indeterminate small left pontine infarct  CTA head & neck Severe stenosis in the left cavernous  and left supraclinoid ICA, and moderate stenosis in the right cavernous and right supraclinoid ICA. Severe stenosis at the  origin of the left vertebral artery.  MRI  Acute nonhemorrhagic left paramedian pontine infarct. More remote lateral pontine infarct corresponds to the finding at CT. Remote linear infarct of the lateral right thalamus. Chronic degenerative changes of the cervical spine as described. 2D Echo pending  LDL 84 HgbA1c 7.3 VTE prophylaxis - lovenox No antithrombotic prior to admission, now on aspirin 81 mg daily and clopidogrel 75 mg daily for 3 weeks and then ASA 81mg  alone Therapy recommendations:  outpt PT  Disposition:  pending    Hypertension Home meds:  irbesartan High on presentation Now stable no the high end Gradually normalize in 48-72 hours Resume home meds Long-term BP goal normotensive   Hyperlipidemia Home meds:  simvastatin 40 LDL 84, goal < 70 Switched to atorvastatin 80mg  Continue statin at discharge   Diabetes type II Uncontrolled Home meds:  metformin HgbA1c 7.3, goal < 7.0 CBGs SSI Close PCP follow up for better DM control   Tobacco abuse Current smoker Smoking cessation counseling provided Nicotine patch provided Pt is willing to quit   Other Stroke Risk Factors Advanced Age >/= 42  Hx stroke/TIA Previous left pontine strokes noted on MRI, however patient denies known history of stroke   Other Active Problems Osteoporosis On fosamax Psoriasis  On Taltz q28 days  Hospital day # 0  Neurology will sign off. Please call with questions. Pt will follow up with stroke clinic NP at [facility] in about 4 weeks. Thanks for the consult.   Marvel Plan, MD PhD Stroke Neurology 06/14/2023 7:31 PM    To contact Stroke Continuity provider, please refer to [email or web address]. After hours, contact General Neurology

## 2023-06-14 NOTE — TOC Initial Note (Signed)
Transition of Care [facility] - Initial/Assessment Note    Patient Details  Name: Kristi Avery MRN: [id number] Date of Birth: 06-10-1956  [facility]) CM/SW Contact:    Kermit Balo, RN Phone Number: 06/14/2023, 11:23 AM  Clinical Narrative:                 Pt is from home with her spouse and daughter. Her daughter works from home.  No DME.  Pt drives self but family can provide transportation if needed.  She manages her own medications and denies any issues.  Outpatient therapy recommended. She prefers to attend at [facility]. Referral sent and information on the AVS. She is aware to call and schedule the first appointment. Family will transport home when medically ready.  Expected Discharge Plan: OP Rehab Barriers to Discharge: Continued Medical Work up   Patient Goals and CMS Choice     Choice offered to / list presented to : Patient      Expected Discharge Plan and Services   Discharge Planning Services: CM Consult   Living arrangements for the past 2 months: Single Family Home                                      Prior Living Arrangements/Services Living arrangements for the past 2 months: Single Family Home Lives with:: Spouse, Adult Children Patient language and need for interpreter reviewed:: Yes Do you feel safe going back to the place where you live?: Yes            Criminal Activity/Legal Involvement Pertinent to Current Situation/Hospitalization: No - Comment as needed  Activities of Daily Living Home Assistive Devices/Equipment: None ADL Screening (condition at time of admission) Patient's cognitive ability adequate to safely complete daily activities?: Yes Is the patient deaf or have difficulty hearing?: No Does the patient have difficulty seeing, even when wearing glasses/contacts?: No Does the patient have difficulty concentrating, remembering, or making decisions?: No Patient able to express need for assistance with ADLs?:  Yes Does the patient have difficulty dressing or bathing?: No Independently performs ADLs?: Yes (appropriate for developmental age) Does the patient have difficulty walking or climbing stairs?: No Weakness of Legs: None Weakness of Arms/Hands: None  Permission Sought/Granted                  Emotional Assessment Appearance:: Appears stated age Attitude/Demeanor/Rapport: Engaged Affect (typically observed): Accepting Orientation: : Oriented to Self, Oriented to Place, Oriented to  Time, Oriented to Situation   Psych Involvement: No (comment)  Admission diagnosis:  Dizziness [R42] Acute ischemic stroke [facility]) [I63.9] Cerebrovascular accident (CVA), unspecified mechanism (HCC) [I63.9] Hyperglycemia due to diabetes mellitus (HCC) [E11.65] Patient Active Problem List   Diagnosis Date Noted   CKD stage 3a, GFR 45-59 ml/min (HCC) 06/13/2023   Acute ischemic stroke (HCC) 06/12/2023   Dysuria 10/31/2016   Urinary frequency 10/31/2016   Clinical infection 10/31/2016   Acute left-sided low back pain without sciatica 10/31/2016   Fibroids 03/28/2012   Menopausal symptoms 03/28/2012   Psoriasis 03/28/2012   Type II diabetes mellitus (HCC) 03/28/2012   Hypertension 03/28/2012   Hyperlipidemia 03/28/2012   Insomnia    GERD (gastroesophageal reflux disease)    PCP:  [name], MD Pharmacy:   [facility] [phone] - [place], [place] - [address] AT [facility] OF [facility] & [phone] [place] [address] Phone: [phone] Fax: [phone]     Social Determinants of Health (SDOH) Social History: SDOH Screenings   Food Insecurity: No Food Insecurity (06/13/2023)  Housing: Low Risk  (06/13/2023)  Transportation Needs: No Transportation Needs (06/13/2023)  Utilities: Not At Risk (06/13/2023)  Tobacco Use: High Risk (06/12/2023)   SDOH Interventions:     Readmission Risk Interventions     No data to display

## 2023-06-14 NOTE — Plan of Care (Signed)
Pt is alert and oriented x4. Up adlib in room steady gait. No reports of dizziness. Pain level O. NIH 3 (aphasia, dysarthria and slight right droop) vitals stable. BP slightly elevated but not meeting parameters for prn HTN meds.  Problem: Education: Goal: Ability to describe self-care measures that may prevent or decrease complications (Diabetes Survival Skills Education) will improve Outcome: Progressing Goal: Individualized Educational Video(s) Outcome: Progressing   Problem: Coping: Goal: Ability to adjust to condition or change in health will improve Outcome: Progressing   Problem: Fluid Volume: Goal: Ability to maintain a balanced intake and output will improve Outcome: Progressing   Problem: Health Behavior/Discharge Planning: Goal: Ability to identify and utilize available resources and services will improve Outcome: Progressing Goal: Ability to manage health-related needs will improve Outcome: Progressing   Problem: Metabolic: Goal: Ability to maintain appropriate glucose levels will improve Outcome: Progressing   Problem: Nutritional: Goal: Maintenance of adequate nutrition will improve Outcome: Progressing Goal: Progress toward achieving an optimal weight will improve Outcome: Progressing   Problem: Skin Integrity: Goal: Risk for impaired skin integrity will decrease Outcome: Progressing   Problem: Tissue Perfusion: Goal: Adequacy of tissue perfusion will improve Outcome: Progressing   Problem: Education: Goal: Knowledge of disease or condition will improve Outcome: Progressing Goal: Knowledge of secondary prevention will improve (MUST DOCUMENT ALL) Outcome: Progressing Goal: Knowledge of patient specific risk factors will improve Loraine Leriche N/A or DELETE if not current risk factor) Outcome: Progressing   Problem: Ischemic Stroke/TIA Tissue Perfusion: Goal: Complications of ischemic stroke/TIA will be minimized Outcome: Progressing   Problem: Coping: Goal:  Will verbalize positive feelings about self Outcome: Progressing Goal: Will identify appropriate support needs Outcome: Progressing   Problem: Health Behavior/Discharge Planning: Goal: Ability to manage health-related needs will improve Outcome: Progressing Goal: Goals will be collaboratively established with patient/family Outcome: Progressing   Problem: Self-Care: Goal: Ability to participate in self-care as condition permits will improve Outcome: Progressing Goal: Verbalization of feelings and concerns over difficulty with self-care will improve Outcome: Progressing Goal: Ability to communicate needs accurately will improve Outcome: Progressing   Problem: Nutrition: Goal: Risk of aspiration will decrease Outcome: Progressing Goal: Dietary intake will improve Outcome: Progressing   Problem: Education: Goal: Knowledge of General Education information will improve Description: Including pain rating scale, medication(s)/side effects and non-pharmacologic comfort measures Outcome: Progressing   Problem: Health Behavior/Discharge Planning: Goal: Ability to manage health-related needs will improve Outcome: Progressing   Problem: Clinical Measurements: Goal: Ability to maintain clinical measurements within normal limits will improve Outcome: Progressing Goal: Will remain free from infection Outcome: Progressing Goal: Diagnostic test results will improve Outcome: Progressing Goal: Respiratory complications will improve Outcome: Progressing Goal: Cardiovascular complication will be avoided Outcome: Progressing   Problem: Activity: Goal: Risk for activity intolerance will decrease Outcome: Progressing   Problem: Nutrition: Goal: Adequate nutrition will be maintained Outcome: Progressing   Problem: Coping: Goal: Level of anxiety will decrease Outcome: Progressing   Problem: Elimination: Goal: Will not experience complications related to bowel motility Outcome:  Progressing Goal: Will not experience complications related to urinary retention Outcome: Progressing   Problem: Pain Managment: Goal: General experience of comfort will improve Outcome: Progressing   Problem: Safety: Goal: Ability to remain free from injury will improve Outcome: Progressing   Problem: Skin Integrity: Goal: Risk for impaired skin integrity will decrease Outcome: Progressing

## 2023-06-15 ENCOUNTER — Other Ambulatory Visit (HOSPITAL_COMMUNITY): Payer: Self-pay

## 2023-06-15 DIAGNOSIS — I639 Cerebral infarction, unspecified: Secondary | ICD-10-CM | POA: Diagnosis not present

## 2023-06-15 LAB — BASIC METABOLIC PANEL
Anion gap: 6 (ref 5–15)
BUN: 18 mg/dL (ref 8–23)
CO2: 19 mmol/L — ABNORMAL LOW (ref 22–32)
Calcium: 8.5 mg/dL — ABNORMAL LOW (ref 8.9–10.3)
Chloride: 112 mmol/L — ABNORMAL HIGH (ref 98–111)
Creatinine, Ser: 1.54 mg/dL — ABNORMAL HIGH (ref 0.44–1.00)
GFR, Estimated: 37 mL/min — ABNORMAL LOW (ref >60)
Glucose, Bld: 142 mg/dL — ABNORMAL HIGH (ref 70–99)
Potassium: 4 mmol/L (ref 3.5–5.1)
Sodium: 137 mmol/L (ref 135–145)

## 2023-06-15 LAB — GLUCOSE, CAPILLARY: Glucose-Capillary: 145 mg/dL — ABNORMAL HIGH (ref 70–99)

## 2023-06-15 MED ORDER — AMLODIPINE BESYLATE 5 MG PO TABS
5.0000 mg | ORAL_TABLET | Freq: Every day | ORAL | Status: DC
  Administered 2023-06-15: 5 mg via ORAL
  Filled 2023-06-15: qty 1

## 2023-06-15 MED ORDER — AMLODIPINE BESYLATE 5 MG PO TABS
5.0000 mg | ORAL_TABLET | Freq: Every day | ORAL | 0 refills | Status: AC
  Filled 2023-06-15: qty 30, 30d supply, fill #0

## 2023-06-15 NOTE — Plan of Care (Signed)
  Problem: Education: Goal: Knowledge of disease or condition will improve Outcome: Progressing Goal: Knowledge of patient specific risk factors will improve Kristi Avery N/A or DELETE if not current risk factor) Outcome: Progressing   Problem: Ischemic Stroke/TIA Tissue Perfusion: Goal: Complications of ischemic stroke/TIA will be minimized Outcome: Progressing   Problem: Coping: Goal: Will identify appropriate support needs Outcome: Progressing   Problem: Self-Care: Goal: Ability to participate in self-care as condition permits will improve Outcome: Progressing

## 2023-06-15 NOTE — Progress Notes (Signed)
Physical Therapy Treatment  Patient Details Name: Kristi Avery MRN: [id number] DOB: [birth year] Today's Date: 06/15/2023   History of Present Illness Pt is a 67 y/o female who presents to [facility] 06/13/2023 from [facility] with dizziness and slurred speech. 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 PMH significant for DM, HTN, L kidney surgery.    PT Comments  Pt progressing well towards physical therapy goals. She reports feeling at baseline of function, however continues to demonstrate intermittent unsteadiness and slurred speech which is difficult to understand at times. Pt scored 23/24 on the DGI indicating she is not a high risk for falls at this time. Pt reports she may not end up going to outpatient PT as she is currently trying to get on social security and is unsure about copays. Reinforced to patient that recommendation is placed and she should have a referral either in hand or in the system at d/c if she decides she would like to pursue outpatient services. Will continue to follow.    If plan is discharge home, recommend the following: A little help with walking and/or transfers;A little help with bathing/dressing/bathroom;Assistance with cooking/housework;Help with stairs or ramp for entrance;Assist for transportation   Can travel by private vehicle        Equipment Recommendations  None recommended by PT    Recommendations for Other Services       Precautions / Restrictions Precautions Precautions: Fall Restrictions Weight Bearing Restrictions: No     Mobility  Bed Mobility               General bed mobility comments: Pt sitting up EOB when PT arrived.    Transfers Overall transfer level: Modified independent Equipment used: None Transfers: Sit to/from Stand                  Ambulation/Gait Ambulation/Gait assistance: Supervision Gait Distance (Feet): 400 Feet Assistive device: None Gait Pattern/deviations: Step-through pattern,  Decreased stride length, Decreased weight shift to right Gait velocity: Decreased Gait velocity interpretation: 1.31 - 2.62 ft/sec, indicative of limited community ambulator   General Gait Details: Ambulating fairly well with no overt LOB noted. Occasional unsteadiness noted with pt demonstrating ability to recover without assistance.   Stairs Stairs: Yes Stairs assistance: Supervision Stair Management: No rails, Alternating pattern, Forwards Number of Stairs: 5 General stair comments: Practice stairs in rehab gym as part of the DGI. Pt appears very guarded but able to complete without rails and with an alternating step pattern.   Wheelchair Mobility     Tilt Bed    Modified Rankin (Stroke Patients Only) Modified Rankin (Stroke Patients Only) Pre-Morbid Rankin Score: No symptoms Modified Rankin: Moderately severe disability     Balance                                 Standardized Balance Assessment Standardized Balance Assessment : Dynamic Gait Index   Dynamic Gait Index Level Surface: Normal Change in Gait Speed: Normal Gait with Horizontal Head Turns: Normal Gait with Vertical Head Turns: Normal Gait and Pivot Turn: Normal Step Over Obstacle: Mild Impairment Step Around Obstacles: Normal Steps: Normal Total Score: 23      Cognition Arousal: Alert Behavior During Therapy: WFL for tasks assessed/performed Overall Cognitive Status: Within Functional Limits for tasks assessed  Exercises      General Comments        Pertinent Vitals/Pain Pain Assessment Pain Assessment: No/denies pain    Home Living                          Prior Function            PT Goals (current goals can now be found in the care plan section) Acute Rehab PT Goals Patient Stated Goal: Home ASAP, back to independence. PT Goal Formulation: With patient/family Time For Goal Achievement:  06/20/23 Potential to Achieve Goals: Good Progress towards PT goals: Progressing toward goals    Frequency    Min 1X/week      PT Plan      Co-evaluation              AM-PAC PT "6 Clicks" Mobility   Outcome Measure  Help needed turning from your back to your side while in a flat bed without using bedrails?: None Help needed moving from lying on your back to sitting on the side of a flat bed without using bedrails?: None Help needed moving to and from a bed to a chair (including a wheelchair)?: A Little Help needed standing up from a chair using your arms (e.g., wheelchair or bedside chair)?: A Little Help needed to walk in hospital room?: A Little Help needed climbing 3-5 steps with a railing? : A Little 6 Click Score: 20    End of Session Equipment Utilized During Treatment: Gait belt Activity Tolerance: Patient tolerated treatment well Patient left: in bed;with call bell/phone within reach;with family/visitor present Nurse Communication: Mobility status PT Visit Diagnosis: Unsteadiness on feet (R26.81);Other symptoms and signs involving the nervous system (R29.898)     Time: 8295-6213 PT Time Calculation (min) (ACUTE ONLY): 13 min  Charges:    $Physical Performance Test: 8-22 mins PT General Charges $$ ACUTE PT VISIT: 1 Visit                     Kristi Avery, PT, DPT Acute Rehabilitation Services Secure Chat Preferred Office: [phone]    Kristi Avery 06/15/2023, 11:13 AM

## 2023-06-15 NOTE — TOC Transition Note (Signed)
Transition of Care [facility]) - CM/SW Discharge Note   Patient Details  Name: TANEEKA HARDEMAN MRN: [id number] Date of Birth: [birth year]  Transition of Care [facility]) CM/SW Contact:  Kermit Balo, RN Phone Number: 06/15/2023, 9:42 AM   Clinical Narrative:     Pt is discharging home with outpatient therapy arranged through [facility].  Pt has transportation home.  Final next level of care: OP Rehab Barriers to Discharge: No Barriers Identified   Patient Goals and CMS Choice   Choice offered to / list presented to : Patient  Discharge Placement                         Discharge Plan and Services Additional resources added to the After Visit Summary for     Discharge Planning Services: CM Consult                                 Social Determinants of Health (SDOH) Interventions SDOH Screenings   Food Insecurity: No Food Insecurity (06/13/2023)  Housing: Low Risk  (06/13/2023)  Transportation Needs: No Transportation Needs (06/13/2023)  Utilities: Not At Risk (06/13/2023)  Tobacco Use: High Risk (06/12/2023)     Readmission Risk Interventions     No data to display

## 2023-06-15 NOTE — Progress Notes (Signed)
   06/15/23 0846  Vitals  Temp 98.4 F (36.9 C)  Temp Source Oral  BP (!) 174/74 (RN notified)  MAP (mmHg) 104  BP Location Right Arm  BP Method Automatic  Patient Position (if appropriate) Sitting  Pulse Rate 72  Pulse Rate Source Dinamap  ECG Heart Rate 70  Resp 17  MEWS COLOR  MEWS Score Color Green  Oxygen Therapy  SpO2 100 %  MEWS Score  MEWS Temp 0  MEWS Systolic 0  MEWS Pulse 0  MEWS RR 0  MEWS LOC 0  MEWS Score 0   Notified Dr. Lanae Boast of patient BP after oral med given this AM.

## 2023-06-15 NOTE — Progress Notes (Signed)
Discharge instructions given. Patient verbalized understanding and all questions were answered.  ?

## 2023-08-14 ENCOUNTER — Other Ambulatory Visit: Payer: Self-pay

## 2023-08-14 ENCOUNTER — Other Ambulatory Visit (HOSPITAL_COMMUNITY): Payer: Self-pay

## 2023-11-20 ENCOUNTER — Ambulatory Visit (HOSPITAL_BASED_OUTPATIENT_CLINIC_OR_DEPARTMENT_OTHER)
Admission: RE | Admit: 2023-11-20 | Discharge: 2023-11-20 | Disposition: A | Discharge status: Home / Self Care | Payer: Medicare Other | Source: Ambulatory Visit | Attending: Nurse Practitioner | Admitting: Nurse Practitioner

## 2023-11-20 ENCOUNTER — Other Ambulatory Visit (HOSPITAL_COMMUNITY): Payer: Self-pay | Admitting: General Practice

## 2023-11-20 ENCOUNTER — Encounter (HOSPITAL_BASED_OUTPATIENT_CLINIC_OR_DEPARTMENT_OTHER): Payer: Self-pay

## 2023-11-20 DIAGNOSIS — R109 Unspecified abdominal pain: Secondary | ICD-10-CM

## 2024-01-05 ENCOUNTER — Ambulatory Visit: Admitting: Podiatry

## 2024-01-05 ENCOUNTER — Encounter: Payer: Self-pay | Admitting: Podiatry

## 2024-01-05 DIAGNOSIS — I999 Unspecified disorder of circulatory system: Secondary | ICD-10-CM

## 2024-01-05 NOTE — Progress Notes (Signed)
 Subjective:  Patient ID: Kristi Avery, female    DOB: 1956/06/03,  MRN: [id number]  Chief Complaint  Patient presents with   Toe Injury    Pt states she hit foot on bed and has a wound to second toe on right foot which occurred about 4 months ago. Pt has a history of diabetes and the toe doesn't seem to be healing. She states that it hurts to walk on it and she has been padding her shoes with whatever she can to keep her toe from rubbing and causing pain.    68 y.o. female presents with the above complaint.  Patient presents with right second digit contusion with superficial ulceration that has been chronic and nonhealing for last 4 months.  She states that this has been there for quite some time has not gotten better there is some dusky changes to the toe.  She has not seen anyone as prior to seeing me.  She states that she has some history of peripheral arterial disease.  She is a diabetic she wants to get it evaluated she has not had any intervention to the right lower extremity by a vascular doctor.   Review of Systems: Negative except as noted in the HPI. Denies N/V/F/Ch.  Past Medical History:  Diagnosis Date   Abnormal Pap smear 1983   Anemia    Arthritis    Diabetes mellitus without complication (HCC)    Gastric ulcer with hemorrhage 09/01/2012   GERD (gastroesophageal reflux disease)    HLD (hyperlipidemia)    Hypertension    Insomnia    Menorrhagia    Hx   Psoriasis    Ulcer     Current Outpatient Medications:    alendronate (FOSAMAX) 70 MG tablet, Take 70 mg by mouth every Sunday. Take with a full glass of water on an empty stomach., Disp: , Rfl:    amLODipine (NORVASC) 5 MG tablet, Take 1 tablet (5 mg total) by mouth daily., Disp: 30 tablet, Rfl: 0   aspirin EC 81 MG tablet, Take 1 tablet (81 mg total) by mouth daily. Swallow whole., Disp: 120 tablet, Rfl: 3   atorvastatin (LIPITOR) 80 MG tablet, Take 1 tablet (80 mg total) by mouth every evening., Disp: 30 tablet, Rfl:  0   Cholecalciferol (VITAMIN D3 PO), Take 1 capsule by mouth daily., Disp: , Rfl:    Cyanocobalamin (VITAMIN B12 PO), Take 1 tablet by mouth daily., Disp: , Rfl:    cyclobenzaprine (FLEXERIL) 5 MG tablet, Take 5 mg by mouth 3 (three) times daily as needed for muscle spasms., Disp: , Rfl:    irbesartan (AVAPRO) 300 MG tablet, Take 300 mg by mouth daily., Disp: , Rfl:    metFORMIN (GLUCOPHAGE) 1000 MG tablet, Take 500 mg by mouth daily with breakfast., Disp: , Rfl:    pantoprazole (PROTONIX) 40 MG tablet, Take 1 tablet (40 mg total) by mouth daily. (Take this in place of omeprazole while on clopidogrel), Disp: 21 tablet, Rfl: 0   TALTZ 80 MG/ML SOAJ, Inject 80 mg into the skin every 28 (twenty-eight) days., Disp: , Rfl:   Social History   Tobacco Use  Smoking Status Every Day   Current packs/day: 0.50   Average packs/day: 0.5 packs/day for 35.0 years (17.5 ttl pk-yrs)   Types: Cigarettes  Smokeless Tobacco Never    Allergies  Allergen Reactions   Tramadol Nausea And Vomiting and Other (See Comments)    GI Intolerance   Sulfonamide Derivatives Nausea And Vomiting  Objective:  There were no vitals filed for this visit. There is no height or weight on file to calculate BMI. Constitutional Well developed. Well nourished.  Vascular Dorsalis pedis pulses nonpalpable bilaterally. Posterior tibial pulses nonpalpable bilaterally. Capillary refill sluggish to all digits.  No cyanosis or clubbing noted. Pedal hair growth normal.  Neurologic Normal speech. Oriented to person, place, and time. Epicritic sensation to light touch grossly present bilaterally.  Dermatologic Right second digit superficial ulceration noted with dusky changes to the toe with diminished cap refill.  No open wounds or lesion noted.  No signs of infection noted no purulent drainage noted no malodor present.  Orthopedic: Normal joint ROM without pain or crepitus bilaterally. No visible deformities. No bony  tenderness.   Radiographs: None Assessment:   1. Vascular abnormality    Plan:  Patient was evaluated and treated and all questions answered.  Right second digit duskiness/beginning signs of gangrene with underlying vascular abnormality -All questions and concerns were discussed with the patient extensive detail given the findings of vascular changes to the second digit patient will benefit from ABIs PVRs and vascular consult both of the orders were placed -Patient has a high risk of undergoing amputation given the history of diabetes. -If this regresses I encouraged her to go to the emergency room she states understanding  No follow-ups on file.

## 2024-01-10 ENCOUNTER — Ambulatory Visit (HOSPITAL_COMMUNITY)
Admission: RE | Admit: 2024-01-10 | Discharge: 2024-01-10 | Disposition: A | Discharge status: Home / Self Care | Source: Ambulatory Visit | Attending: Vascular Surgery | Admitting: Vascular Surgery

## 2024-01-10 DIAGNOSIS — I999 Unspecified disorder of circulatory system: Secondary | ICD-10-CM | POA: Diagnosis not present

## 2024-01-10 DIAGNOSIS — I739 Peripheral vascular disease, unspecified: Secondary | ICD-10-CM

## 2024-01-11 ENCOUNTER — Other Ambulatory Visit: Payer: Self-pay | Admitting: Podiatry

## 2024-01-11 DIAGNOSIS — I999 Unspecified disorder of circulatory system: Secondary | ICD-10-CM

## 2024-01-11 LAB — VAS US ABI WITH/WO TBI
Left ABI: 0.63
Right ABI: 0.84

## 2024-01-17 NOTE — Progress Notes (Unsigned)
 Office Note     CC:  Right toe wound Requesting Provider:  Candelaria Stagers, DPM  HPI: Kristi Avery is a 68 y.o. ([birth year]) female presenting at the request of .Kristi Corn, MD for right second toe wound.  On exam, Kristi Avery was doing well.  A native of [place], she moved to [place] years ago with her husband who took a new position in trucking.  She has 2 children, and 2 grandchildren.  Her grandson is now sickle cell free after bone marrow transplant from his sister.  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  She continues to live an active, independent lifestyle.  Kristi Avery first stubbed her toe on a trundle bed roughly 4 months ago.  The wound site was healing, however she did it again roughly 1 month ago causing the wound to reopen.  Over the last month, she has changed her wound care routine, and states that the wound is healing.  It is no longer open.  There is a callus over the lesion.  Denies drainage, denies erythema, induration.  She is a daily smoker. The pt is  on a statin for cholesterol management.  The pt is  on a daily aspirin.   Other AC:  - The pt is  on medication for hypertension.   The pt is not diabetic.    Past Medical History:  Diagnosis Date   Abnormal Pap smear 1983   Anemia    Arthritis    Diabetes mellitus without complication (HCC)    Gastric ulcer with hemorrhage 09/01/2012   GERD (gastroesophageal reflux disease)    HLD (hyperlipidemia)    Hypertension    Insomnia    Menorrhagia    Hx   Psoriasis    Ulcer     Past Surgical History:  Procedure Laterality Date   COLONOSCOPY  2007   ESOPHAGOGASTRODUODENOSCOPY  09/01/2012   Procedure: ESOPHAGOGASTRODUODENOSCOPY (EGD);  Surgeon: Louis Meckel, MD;  Location: [facility];  Service: Endoscopy;  Laterality: N/A;   KIDNEY SURGERY     left   TUBAL LIGATION      Social History   Socioeconomic History   Marital status: Married    Spouse name: Not on file    Number of children: 2   Years of education: Not on file   Highest education level: Not on file  Occupational History    Employer: [employer]  Tobacco Use   Smoking status: Every Day    Current packs/day: 0.50    Average packs/day: 0.5 packs/day for 35.0 years (17.5 ttl pk-yrs)    Types: Cigarettes   Smokeless tobacco: Never  Substance and Sexual Activity   Alcohol use: Yes    Alcohol/week: 2.0 - 3.0 standard drinks of alcohol    Types: 2 - 3 Glasses of wine per week   Drug use: No   Sexual activity: Yes    Birth control/protection: Surgical    Comment: BTL  Other Topics Concern   Not on file  Social History Narrative   Not on file   Social Drivers of Health   Financial Resource Strain: Not on file  Food Insecurity: No Food Insecurity (06/13/2023)   Hunger Vital Sign    Worried About Running Out of Food in the Last Year: Never true    Ran Out of Food in the Last Year: Never true  Transportation Needs: No Transportation Needs (06/13/2023)   PRAPARE - Transportation  Lack of Transportation (Medical): No    Lack of Transportation (Non-Medical): No  Physical Activity: Not on file  Stress: Not on file  Social Connections: Not on file  Intimate Partner Violence: Not At Risk (06/13/2023)   Humiliation, Afraid, Rape, and Kick questionnaire    Fear of Current or Ex-Partner: No    Emotionally Abused: No    Physically Abused: No    Sexually Abused: No   Family History  Problem Relation Age of Onset   Diabetes Mother    Heart disease Mother    Stroke Father    Diabetes Sister    Diabetes Brother    Colon cancer Neg Hx    Esophageal cancer Neg Hx    Rectal cancer Neg Hx    Stomach cancer Neg Hx    Breast cancer Neg Hx     Current Outpatient Medications  Medication Sig Dispense Refill   alendronate (FOSAMAX) 70 MG tablet Take 70 mg by mouth every Sunday. Take with a full glass of water on an empty stomach.     amLODipine (NORVASC) 5 MG tablet Take 1  tablet (5 mg total) by mouth daily. 30 tablet 0   aspirin EC 81 MG tablet Take 1 tablet (81 mg total) by mouth daily. Swallow whole. 120 tablet 3   atorvastatin (LIPITOR) 80 MG tablet Take 1 tablet (80 mg total) by mouth every evening. 30 tablet 0   Cholecalciferol (VITAMIN D3 PO) Take 1 capsule by mouth daily.     Cyanocobalamin (VITAMIN B12 PO) Take 1 tablet by mouth daily.     cyclobenzaprine (FLEXERIL) 5 MG tablet Take 5 mg by mouth 3 (three) times daily as needed for muscle spasms.     irbesartan (AVAPRO) 300 MG tablet Take 300 mg by mouth daily.     metFORMIN (GLUCOPHAGE) 1000 MG tablet Take 500 mg by mouth daily with breakfast.     pantoprazole (PROTONIX) 40 MG tablet Take 1 tablet (40 mg total) by mouth daily. (Take this in place of omeprazole while on clopidogrel) 21 tablet 0   TALTZ 80 MG/ML SOAJ Inject 80 mg into the skin every 28 (twenty-eight) days.     No current facility-administered medications for this visit.    Allergies  Allergen Reactions   Tramadol Nausea And Vomiting and Other (See Comments)    GI Intolerance   Sulfonamide Derivatives Nausea And Vomiting     REVIEW OF SYSTEMS:  [X]  denotes positive finding, [ ]  denotes negative finding Cardiac  Comments:  Chest pain or chest pressure:    Shortness of breath upon exertion:    Short of breath when lying flat:    Irregular heart rhythm:        Vascular    Pain in calf, thigh, or hip brought on by ambulation:    Pain in feet at night that wakes you up from your sleep:     Blood clot in your veins:    Leg swelling:         Pulmonary    Oxygen at home:    Productive cough:     Wheezing:         Neurologic    Sudden weakness in arms or legs:     Sudden numbness in arms or legs:     Sudden onset of difficulty speaking or slurred speech:    Temporary loss of vision in one eye:     Problems with dizziness:         Gastrointestinal  Blood in stool:     Vomited blood:         Genitourinary    Burning  when urinating:     Blood in urine:        Psychiatric    Major depression:         Hematologic    Bleeding problems:    Problems with blood clotting too easily:        Skin    Rashes or ulcers:        Constitutional    Fever or chills:      PHYSICAL EXAMINATION:  There were no vitals filed for this visit.  General:  WDWN in NAD; vital signs documented above Gait: Not observed HENT: WNL, normocephalic Pulmonary: normal non-labored breathing , without wheezing Cardiac: regular HR Abdomen: soft, NT, no masses Skin: without rashes Vascular Exam/Pulses:  Right Left  Radial    Ulnar    Femoral    Popliteal    DP absent absent  PT     Extremities: without ischemic changes, without Gangrene , without cellulitis; without open wounds;  Musculoskeletal: no muscle wasting or atrophy  Neurologic: A&O X 3;  No focal weakness or paresthesias are detected Psychiatric:  The pt has Normal affect.   Non-Invasive Vascular Imaging:   ABI Findings:  +---------+------------------+-----+--------+  Right   Rt Pressure (mmHg)IndexWaveform  +---------+------------------+-----+--------+  Brachial 169                              +---------+------------------+-----+--------+  PTA     142               0.84 biphasic  +---------+------------------+-----+--------+  DP      110               0.65 biphasic  +---------+------------------+-----+--------+  Great Toe46                0.27 Abnormal  +---------+------------------+-----+--------+   +---------+------------------+-----+--------+  Left    Lt Pressure (mmHg)IndexWaveform  +---------+------------------+-----+--------+  Brachial 159                              +---------+------------------+-----+--------+  PTA     107               0.63 biphasic  +---------+------------------+-----+--------+  DP      106               0.63 biphasic  +---------+------------------+-----+--------+   Great Toe21                0.12 Abnormal  +---------+------------------+-----+--------+     ASSESSMENT/PLAN: LATREECE MOCHIZUKI is a 68 y.o. female presenting with a slowly healing lesion to the left second toe.  The injury initially happened 4 months ago, but reopened roughly a month ago after trauma-hitting the toe against a bedpost.  On physical exam, there is a large callus present.  No erythema appreciated, no induration.  On physical exam, Rhya had mild to moderate arterial disease, but the toe pressure was depressed.  We discussed the importance of smoking cessation.  Being that the wound has been present for a number of months, I gave her the option of aortogram with possible intervention for nonhealing wound versus continued medical management with plans to assess the wound in another month.  She felt comfortable following up in 1 month.  I gave her  my card.  She plans to see Dr. Allena Katz in 2 weeks.  Should there be any concerns in the interim, I would take her directly to angiography for revascularization.    Victorino Sparrow, MD [facility] [phone]

## 2024-01-18 ENCOUNTER — Encounter: Payer: Self-pay | Admitting: Vascular Surgery

## 2024-01-18 ENCOUNTER — Ambulatory Visit (INDEPENDENT_AMBULATORY_CARE_PROVIDER_SITE_OTHER): Admitting: Vascular Surgery

## 2024-01-18 VITALS — BP 171/80 | HR 90 | Temp 98.2°F | Resp 18 | Ht 61.0 in | Wt 129.7 lb

## 2024-01-18 DIAGNOSIS — L98499 Non-pressure chronic ulcer of skin of other sites with unspecified severity: Secondary | ICD-10-CM | POA: Diagnosis not present

## 2024-01-18 DIAGNOSIS — I70235 Atherosclerosis of native arteries of right leg with ulceration of other part of foot: Secondary | ICD-10-CM | POA: Diagnosis not present

## 2024-01-26 ENCOUNTER — Encounter: Payer: Self-pay | Admitting: Podiatry

## 2024-01-26 ENCOUNTER — Ambulatory Visit: Admitting: Podiatry

## 2024-01-26 DIAGNOSIS — I96 Gangrene, not elsewhere classified: Secondary | ICD-10-CM

## 2024-01-26 DIAGNOSIS — R634 Abnormal weight loss: Secondary | ICD-10-CM | POA: Insufficient documentation

## 2024-01-26 DIAGNOSIS — R351 Nocturia: Secondary | ICD-10-CM | POA: Insufficient documentation

## 2024-01-26 DIAGNOSIS — I999 Unspecified disorder of circulatory system: Secondary | ICD-10-CM

## 2024-01-26 DIAGNOSIS — M8580 Other specified disorders of bone density and structure, unspecified site: Secondary | ICD-10-CM | POA: Insufficient documentation

## 2024-01-26 DIAGNOSIS — E559 Vitamin D deficiency, unspecified: Secondary | ICD-10-CM | POA: Insufficient documentation

## 2024-01-26 NOTE — Progress Notes (Signed)
 Subjective:  Patient ID: Kristi Avery, female    DOB: 1956/03/23,  MRN: [id number]  Chief Complaint  Patient presents with   Foot Ulcer    Follow up ulcer 2nd toe right   "It looks better to me. I started using the mupirocin ointment instead of betadine"    68 y.o. female presents with the above complaint.  Patient presents with right second digit contusion with superficial ulceration that has been chronic and nonhealing for last 4 months.  She states that this has been there for quite some time has not gotten better there is some dusky changes to the toe.  She has not seen anyone as prior to seeing me.  She states that she has some history of peripheral arterial disease.  She is a diabetic she wants to get it evaluated she has not had any intervention to the right lower extremity by a vascular doctor.   Review of Systems: Negative except as noted in the HPI. Denies N/V/F/Ch.  Past Medical History:  Diagnosis Date   Abnormal Pap smear 10/24/1981   Anemia    Arthritis    Diabetes mellitus without complication (HCC)    Gastric ulcer with hemorrhage 09/01/2012   GERD (gastroesophageal reflux disease)    HLD (hyperlipidemia)    Hypertension    Insomnia    Menorrhagia    Hx   Peripheral vascular disease (HCC)    Psoriasis    Ulcer     Current Outpatient Medications:    simvastatin (ZOCOR) 40 MG tablet, Take 40 mg by mouth daily., Disp: , Rfl:    alendronate (FOSAMAX) 70 MG tablet, Take 70 mg by mouth every Sunday. Take with a full glass of water on an empty stomach., Disp: , Rfl:    amLODipine (NORVASC) 5 MG tablet, Take 1 tablet (5 mg total) by mouth daily., Disp: 30 tablet, Rfl: 0   aspirin EC 81 MG tablet, Take 1 tablet (81 mg total) by mouth daily. Swallow whole., Disp: 120 tablet, Rfl: 3   atorvastatin (LIPITOR) 80 MG tablet, Take 1 tablet (80 mg total) by mouth every evening., Disp: 30 tablet, Rfl: 0   Cholecalciferol (VITAMIN D3 PO), Take 1 capsule by mouth daily., Disp: ,  Rfl:    Cyanocobalamin (VITAMIN B12 PO), Take 1 tablet by mouth daily., Disp: , Rfl:    cyclobenzaprine (FLEXERIL) 5 MG tablet, Take 5 mg by mouth 3 (three) times daily as needed for muscle spasms., Disp: , Rfl:    irbesartan (AVAPRO) 300 MG tablet, Take 300 mg by mouth daily., Disp: , Rfl:    metFORMIN (GLUCOPHAGE) 1000 MG tablet, Take 500 mg by mouth daily with breakfast., Disp: , Rfl:    pantoprazole (PROTONIX) 40 MG tablet, Take 1 tablet (40 mg total) by mouth daily. (Take this in place of omeprazole while on clopidogrel), Disp: 21 tablet, Rfl: 0   TALTZ 80 MG/ML SOAJ, Inject 80 mg into the skin every 28 (twenty-eight) days., Disp: , Rfl:   Social History   Tobacco Use  Smoking Status Every Day   Current packs/day: 0.50   Average packs/day: 0.5 packs/day for 35.0 years (17.5 ttl pk-yrs)   Types: Cigarettes  Smokeless Tobacco Never    Allergies  Allergen Reactions   Tramadol Nausea And Vomiting and Other (See Comments)    GI Intolerance   Sulfonamide Derivatives Nausea And Vomiting   Objective:  There were no vitals filed for this visit. There is no height or weight on file to calculate  BMI. Constitutional Well developed. Well nourished.  Vascular Dorsalis pedis pulses nonpalpable bilaterally. Posterior tibial pulses nonpalpable bilaterally. Capillary refill sluggish to all digits.  No cyanosis or clubbing noted. Pedal hair growth normal.  Neurologic Normal speech. Oriented to person, place, and time. Epicritic sensation to light touch grossly present bilaterally.  Dermatologic Right second digit superficial ulceration noted with dusky changes to the toe with diminished cap refill.  No open wounds or lesion noted.  No signs of infection noted no purulent drainage noted no malodor present.  Orthopedic: Normal joint ROM without pain or crepitus bilaterally. No visible deformities. No bony tenderness.   Radiographs: None Assessment:   1. Vascular abnormality   2.  Gangrene of toe of right foot (HCC)     Plan:  Patient was evaluated and treated and all questions answered.  Right second digit duskiness/beginning signs of gangrene with underlying vascular abnormality -All questions and concerns were discussed with the patient extensive detail given these findings patient will benefit from following with vascular surgery for possible angiogram.  The wound is improving slowly - Continue mupirocin ointment and Band-Aid - Continue wearing surgical shoe -if it regresses this patient will need to go to the emergency room right away she states understanding. No follow-ups on file.

## 2024-02-22 ENCOUNTER — Ambulatory Visit

## 2024-03-08 ENCOUNTER — Ambulatory Visit: Admitting: Podiatry

## 2024-03-08 DIAGNOSIS — I999 Unspecified disorder of circulatory system: Secondary | ICD-10-CM

## 2024-03-08 NOTE — Progress Notes (Signed)
 Subjective:  Patient ID: Kristi Avery, female    DOB: 1956/10/01,  MRN: [id number]  Chief Complaint  Patient presents with   Vascular abnormality    Pt stated that she is doing much better     68 y.o. female presents with the above complaint.  Patient presents with right second digit contusion with superficial ulceration that has been chronic and nonhealing for last 4 months.  She states that this has been there for quite some time has not gotten better there is some dusky changes to the toe.  She has not seen anyone as prior to seeing me.  She states that she has some history of peripheral arterial disease.  She is a diabetic she wants to get it evaluated she has not had any intervention to the right lower extremity by a vascular doctor.   Review of Systems: Negative except as noted in the HPI. Denies N/V/F/Ch.  Past Medical History:  Diagnosis Date   Abnormal Pap smear 10/24/1981   Anemia    Arthritis    Diabetes mellitus without complication (HCC)    Gastric ulcer with hemorrhage 09/01/2012   GERD (gastroesophageal reflux disease)    HLD (hyperlipidemia)    Hypertension    Insomnia    Menorrhagia    Hx   Peripheral vascular disease (HCC)    Psoriasis    Ulcer     Current Outpatient Medications:    alendronate (FOSAMAX) 70 MG tablet, Take 70 mg by mouth every Sunday. Take with a full glass of water on an empty stomach., Disp: , Rfl:    amLODipine  (NORVASC ) 5 MG tablet, Take 1 tablet (5 mg total) by mouth daily., Disp: 30 tablet, Rfl: 0   aspirin  EC 81 MG tablet, Take 1 tablet (81 mg total) by mouth daily. Swallow whole., Disp: 120 tablet, Rfl: 3   atorvastatin  (LIPITOR) 80 MG tablet, Take 1 tablet (80 mg total) by mouth every evening., Disp: 30 tablet, Rfl: 0   Cholecalciferol (VITAMIN D3 PO), Take 1 capsule by mouth daily., Disp: , Rfl:    Cyanocobalamin (VITAMIN B12 PO), Take 1 tablet by mouth daily., Disp: , Rfl:    cyclobenzaprine  (FLEXERIL ) 5 MG tablet, Take 5 mg by  mouth 3 (three) times daily as needed for muscle spasms., Disp: , Rfl:    irbesartan  (AVAPRO ) 300 MG tablet, Take 300 mg by mouth daily., Disp: , Rfl:    metFORMIN  (GLUCOPHAGE ) 1000 MG tablet, Take 500 mg by mouth daily with breakfast., Disp: , Rfl:    pantoprazole  (PROTONIX ) 40 MG tablet, Take 1 tablet (40 mg total) by mouth daily. (Take this in place of omeprazole  while on clopidogrel ), Disp: 21 tablet, Rfl: 0   simvastatin  (ZOCOR ) 40 MG tablet, Take 40 mg by mouth daily., Disp: , Rfl:    TALTZ 80 MG/ML SOAJ, Inject 80 mg into the skin every 28 (twenty-eight) days., Disp: , Rfl:   Social History   Tobacco Use  Smoking Status Every Day   Current packs/day: 0.50   Average packs/day: 0.5 packs/day for 35.0 years (17.5 ttl pk-yrs)   Types: Cigarettes  Smokeless Tobacco Never    Allergies  Allergen Reactions   Tramadol Nausea And Vomiting and Other (See Comments)    GI Intolerance   Sulfonamide Derivatives Nausea And Vomiting   Objective:  There were no vitals filed for this visit. There is no height or weight on file to calculate BMI. Constitutional Well developed. Well nourished.  Vascular Dorsalis pedis pulses nonpalpable bilaterally.  Posterior tibial pulses nonpalpable bilaterally. Capillary refill sluggish to all digits.  No cyanosis or clubbing noted. Pedal hair growth normal.  Neurologic Normal speech. Oriented to person, place, and time. Epicritic sensation to light touch grossly present bilaterally.  Dermatologic Right second digit superficial ulceration noted no foot or dusky changes noted.  No open wounds or lesion noted.  No signs of infection noted no purulent drainage noted no malodor present.  Orthopedic: Normal joint ROM without pain or crepitus bilaterally. No visible deformities. No bony tenderness.   Radiographs: None Assessment:   1. Vascular abnormality      Plan:  Patient was evaluated and treated and all questions answered.  Right second digit  ulceration with fat layer exposed stable granular wound base.  Improving slowly -All questions and concerns were discussed with the patient extensive detail patient still has ulceration present underneath the callus.  I discussed with her to continue doing local wound care until completely reepithelialized ulcer.  It is improving slowly. - She is scheduled to see Dr. Christia Cowboy in few weeks for further discussion. -Weightbearing as tolerated in surgical shoe No follow-ups on file.

## 2024-04-04 ENCOUNTER — Ambulatory Visit

## 2024-04-11 ENCOUNTER — Ambulatory Visit: Attending: Vascular Surgery | Admitting: Physician Assistant

## 2024-04-11 ENCOUNTER — Encounter: Payer: Self-pay | Admitting: Physician Assistant

## 2024-04-11 VITALS — BP 133/72 | HR 76 | Temp 98.7°F | Ht 61.0 in | Wt 130.8 lb

## 2024-04-11 DIAGNOSIS — L98499 Non-pressure chronic ulcer of skin of other sites with unspecified severity: Secondary | ICD-10-CM

## 2024-04-11 NOTE — Progress Notes (Signed)
 HISTORY AND PHYSICAL     CC:  follow up. Requesting Provider:  Margarete Sharps, MD  HPI: This is a 68 y.o. female who is here today for follow up for PAD.  Pt has hx of right 2nd toe wound.   Pt was last seen 01/18/2024 and at that time, her toe pressure was depressed and Dr. Rosalva Comber discussed with her angiogram vs medical management.  She wanted medical management with f/u in a few weeks.    She is being followed by Dr. Lydia Sams and per his note about a month ago, the toe wound was slowly improving.  She states today that her toe is much improved.  She states that she did drop a can out of the pantry on her left great toe but this was also improving.  She denies any rest pain or claudication.    She has hx of CVA, DM, CKD IIIa, lumbar radiculopathy.  The pt returns today for follow up.  She states that her toe ulcer is better.    The pt is on a statin for cholesterol management.    The pt is on an aspirin .    Other AC:  none The pt is on CCB, ARB for hypertension.  The pt is  on diabetic medication. Tobacco hx:  current  Pt does not have family hx of AAA.  Past Medical History:  Diagnosis Date   Abnormal Pap smear 10/24/1981   Anemia    Arthritis    Diabetes mellitus without complication (HCC)    Gastric ulcer with hemorrhage 09/01/2012   GERD (gastroesophageal reflux disease)    HLD (hyperlipidemia)    Hypertension    Insomnia    Menorrhagia    Hx   Peripheral vascular disease (HCC)    Psoriasis    Ulcer     Past Surgical History:  Procedure Laterality Date   COLONOSCOPY  2007   ESOPHAGOGASTRODUODENOSCOPY  09/01/2012   Procedure: ESOPHAGOGASTRODUODENOSCOPY (EGD);  Surgeon: Claudette Cue, MD;  Location: [facility];  Service: Endoscopy;  Laterality: N/A;   KIDNEY SURGERY     left   TUBAL LIGATION      Allergies  Allergen Reactions   Tramadol Nausea And Vomiting and Other (See Comments)    GI Intolerance   Sulfonamide Derivatives Nausea And Vomiting    Current  Outpatient Medications  Medication Sig Dispense Refill   alendronate (FOSAMAX) 70 MG tablet Take 70 mg by mouth every Sunday. Take with a full glass of water on an empty stomach.     amLODipine  (NORVASC ) 5 MG tablet Take 1 tablet (5 mg total) by mouth daily. 30 tablet 0   aspirin  EC 81 MG tablet Take 1 tablet (81 mg total) by mouth daily. Swallow whole. 120 tablet 3   atorvastatin  (LIPITOR) 80 MG tablet Take 1 tablet (80 mg total) by mouth every evening. 30 tablet 0   Cholecalciferol (VITAMIN D3 PO) Take 1 capsule by mouth daily.     Cyanocobalamin (VITAMIN B12 PO) Take 1 tablet by mouth daily.     cyclobenzaprine  (FLEXERIL ) 5 MG tablet Take 5 mg by mouth 3 (three) times daily as needed for muscle spasms.     irbesartan  (AVAPRO ) 300 MG tablet Take 300 mg by mouth daily.     metFORMIN  (GLUCOPHAGE ) 1000 MG tablet Take 500 mg by mouth daily with breakfast.     pantoprazole  (PROTONIX ) 40 MG tablet Take 1 tablet (40 mg total) by mouth daily. (Take this in place of omeprazole  while  on clopidogrel ) 21 tablet 0   simvastatin  (ZOCOR ) 40 MG tablet Take 40 mg by mouth daily.     TALTZ 80 MG/ML SOAJ Inject 80 mg into the skin every 28 (twenty-eight) days.     No current facility-administered medications for this visit.    Family History  Problem Relation Age of Onset   Diabetes Mother    Heart disease Mother    Stroke Father    Diabetes Sister    Diabetes Brother    Colon cancer Neg Hx    Esophageal cancer Neg Hx    Rectal cancer Neg Hx    Stomach cancer Neg Hx    Breast cancer Neg Hx     Social History   Socioeconomic History   Marital status: Married    Spouse name: Not on file   Number of children: 2   Years of education: Not on file   Highest education level: Not on file  Occupational History    Employer: [employer]  Tobacco Use   Smoking status: Every Day    Current packs/day: 0.50    Average packs/day: 0.5 packs/day for 35.0 years (17.5 ttl pk-yrs)    Types:  Cigarettes   Smokeless tobacco: Never  Vaping Use   Vaping status: Never Used  Substance and Sexual Activity   Alcohol  use: Yes    Alcohol /week: 2.0 - 3.0 standard drinks of alcohol     Types: 2 - 3 Glasses of wine per week   Drug use: No   Sexual activity: Yes    Birth control/protection: Surgical    Comment: BTL  Other Topics Concern   Not on file  Social History Narrative   Not on file   Social Drivers of Health   Financial Resource Strain: Not on file  Food Insecurity: No Food Insecurity (06/13/2023)   Hunger Vital Sign    Worried About Running Out of Food in the Last Year: Never true    Ran Out of Food in the Last Year: Never true  Transportation Needs: No Transportation Needs (06/13/2023)   PRAPARE - Administrator, Civil Service (Medical): No    Lack of Transportation (Non-Medical): No  Physical Activity: Not on file  Stress: Not on file  Social Connections: Not on file  Intimate Partner Violence: Not At Risk (06/13/2023)   Humiliation, Afraid, Rape, and Kick questionnaire    Fear of Current or Ex-Partner: No    Emotionally Abused: No    Physically Abused: No    Sexually Abused: No     REVIEW OF SYSTEMS:   [X]  denotes positive finding, [ ]  denotes negative finding Cardiac  Comments:  Chest pain or chest pressure:    Shortness of breath upon exertion:    Short of breath when lying flat:    Irregular heart rhythm:        Vascular    Pain in calf, thigh, or hip brought on by ambulation:    Pain in feet at night that wakes you up from your sleep:     Blood clot in your veins:    Leg swelling:         Pulmonary    Oxygen at home:    Productive cough:     Wheezing:         Neurologic    Sudden weakness in arms or legs:     Sudden numbness in arms or legs:     Sudden onset of difficulty speaking or slurred speech:  Temporary loss of vision in one eye:     Problems with dizziness:         Gastrointestinal    Blood in stool:     Vomited  blood:         Genitourinary    Burning when urinating:     Blood in urine:        Psychiatric    Major depression:         Hematologic    Bleeding problems:    Problems with blood clotting too easily:        Skin    Rashes or ulcers:        Constitutional    Fever or chills:      PHYSICAL EXAMINATION:  Today's Vitals   04/11/24 0850  BP: 133/72  Pulse: 76  Temp: 98.7 F (37.1 C)  TempSrc: Temporal  Weight: 130 lb 12.8 oz (59.3 kg)  Height: 5' 1 (1.549 m)  PainSc: 0-No pain   Body mass index is 24.71 kg/m.   General:  WDWN in NAD; vital signs documented above Gait: Not observed HENT: WNL, normocephalic Pulmonary: normal non-labored breathing , without wheezing Cardiac: regular HR, without carotid bruits Abdomen: soft, NT; aortic pulse is not palpable Skin: without rashes Vascular Exam/Pulses: Unable to palpate pedal pulses.  Bilateral feet are warm.  Extremities: without ischemic changes, without Gangrene , without cellulitis; without open wounds  Musculoskeletal: no muscle wasting or atrophy  Neurologic: A&O X 3 Psychiatric:  The pt has Normal affect.   Non-Invasive Vascular Imaging:    Previous ABI's/TBI's on 01/11/2024: Right:  0.84/0.27 - Great toe pressure: 46 Left:  0.63/0.12 - Great toe pressure:  21    ASSESSMENT/PLAN:: 68 y.o. female here for follow up for PAD with hx of right 2nd toe wound.    -pt toe improved and she does not have rest pain or claudication.   -continue asa/statin -discussed importance of increased walking daily and protecting her feet.   -pt will f/u in one year with ABI.  She will call sooner if she develops any rest pain or non healing wounds.  -current smoker-discussed the importance of smoking cessation.  Discussed that she is at higher risk for limb loss being diabetic and smoking.  Also discussed it puts her at risk for MI, stroke, cancers.     Maryanna Smart, [facility] [phone]  Clinic MD:   Rosalva Comber

## 2024-04-19 ENCOUNTER — Ambulatory Visit: Admitting: Podiatry

## 2024-05-15 ENCOUNTER — Ambulatory Visit: Admitting: Podiatry

## 2024-05-15 DIAGNOSIS — I96 Gangrene, not elsewhere classified: Secondary | ICD-10-CM | POA: Diagnosis not present

## 2024-05-15 DIAGNOSIS — I999 Unspecified disorder of circulatory system: Secondary | ICD-10-CM | POA: Diagnosis not present

## 2024-05-15 NOTE — Progress Notes (Signed)
 Subjective:  Patient ID: Kristi Avery, female    DOB: 01/10/1956,  MRN: [id number]  Chief Complaint  Patient presents with   Vascular abnormality    Pt stated that she is doing better     68 y.o. female presents with the above complaint.  Patient presents with right second digit contusion with superficial ulceration that has been chronic and nonhealing for last 4 months.  She states that this has been there for quite some time has not gotten better there is some dusky changes to the toe.  She has not seen anyone as prior to seeing me.  She states that she has some history of peripheral arterial disease.  She is a diabetic she wants to get it evaluated she has not had any intervention to the right lower extremity by a vascular doctor.   Review of Systems: Negative except as noted in the HPI. Denies N/V/F/Ch.  Past Medical History:  Diagnosis Date   Abnormal Pap smear 10/24/1981   Anemia    Arthritis    Diabetes mellitus without complication (HCC)    Gastric ulcer with hemorrhage 09/01/2012   GERD (gastroesophageal reflux disease)    HLD (hyperlipidemia)    Hypertension    Insomnia    Menorrhagia    Hx   Peripheral vascular disease (HCC)    Psoriasis    Ulcer     Current Outpatient Medications:    alendronate (FOSAMAX) 70 MG tablet, Take 70 mg by mouth every Sunday. Take with a full glass of water on an empty stomach., Disp: , Rfl:    amLODipine  (NORVASC ) 5 MG tablet, Take 1 tablet (5 mg total) by mouth daily., Disp: 30 tablet, Rfl: 0   aspirin  EC 81 MG tablet, Take 1 tablet (81 mg total) by mouth daily. Swallow whole., Disp: 120 tablet, Rfl: 3   atorvastatin  (LIPITOR) 80 MG tablet, Take 1 tablet (80 mg total) by mouth every evening., Disp: 30 tablet, Rfl: 0   Cholecalciferol (VITAMIN D3 PO), Take 1 capsule by mouth daily., Disp: , Rfl:    Cyanocobalamin (VITAMIN B12 PO), Take 1 tablet by mouth daily., Disp: , Rfl:    cyclobenzaprine  (FLEXERIL ) 5 MG tablet, Take 5 mg by mouth 3  (three) times daily as needed for muscle spasms., Disp: , Rfl:    irbesartan  (AVAPRO ) 300 MG tablet, Take 300 mg by mouth daily., Disp: , Rfl:    metFORMIN  (GLUCOPHAGE ) 1000 MG tablet, Take 500 mg by mouth daily with breakfast., Disp: , Rfl:    pantoprazole  (PROTONIX ) 40 MG tablet, Take 1 tablet (40 mg total) by mouth daily. (Take this in place of omeprazole  while on clopidogrel ), Disp: 21 tablet, Rfl: 0   simvastatin  (ZOCOR ) 40 MG tablet, Take 40 mg by mouth daily., Disp: , Rfl:    TALTZ 80 MG/ML SOAJ, Inject 80 mg into the skin every 28 (twenty-eight) days., Disp: , Rfl:   Social History   Tobacco Use  Smoking Status Every Day   Current packs/day: 0.50   Average packs/day: 0.5 packs/day for 35.0 years (17.5 ttl pk-yrs)   Types: Cigarettes  Smokeless Tobacco Never    Allergies  Allergen Reactions   Tramadol Nausea And Vomiting and Other (See Comments)    GI Intolerance   Sulfonamide Derivatives Nausea And Vomiting   Objective:  There were no vitals filed for this visit. There is no height or weight on file to calculate BMI. Constitutional Well developed. Well nourished.  Vascular Dorsalis pedis pulses nonpalpable bilaterally. Posterior  tibial pulses nonpalpable bilaterally. Capillary refill sluggish to all digits.  No cyanosis or clubbing noted. Pedal hair growth normal.  Neurologic Normal speech. Oriented to person, place, and time. Epicritic sensation to light touch grossly present bilaterally.  Dermatologic Right second digit ulceration completely epithelialized.  No further dusky changes or complications noted.  No breakdown of skin noted  Orthopedic: Normal joint ROM without pain or crepitus bilaterally. No visible deformities. No bony tenderness.   Radiographs: None Assessment:   No diagnosis found.    Plan:  Patient was evaluated and treated and all questions answered.  Right second digit ulceration with fat layer exposed stable granular wound base.  -  Clinically healed anal fissure discharge from my care if any foot and ankle issues in the future she will come back and see me.  I discussed shoe gear modification.  At this time if there is any circulation issues she will see her physician and may need vascular No follow-ups on file.

## 2024-06-12 ENCOUNTER — Other Ambulatory Visit: Payer: Self-pay

## 2024-06-12 ENCOUNTER — Emergency Department (HOSPITAL_BASED_OUTPATIENT_CLINIC_OR_DEPARTMENT_OTHER)
Admission: EM | Admit: 2024-06-12 | Discharge: 2024-06-12 | Disposition: A | Discharge status: Home / Self Care | Attending: Emergency Medicine | Admitting: Emergency Medicine

## 2024-06-12 DIAGNOSIS — E119 Type 2 diabetes mellitus without complications: Secondary | ICD-10-CM | POA: Insufficient documentation

## 2024-06-12 DIAGNOSIS — Z7982 Long term (current) use of aspirin: Secondary | ICD-10-CM | POA: Diagnosis not present

## 2024-06-12 DIAGNOSIS — L270 Generalized skin eruption due to drugs and medicaments taken internally: Secondary | ICD-10-CM | POA: Insufficient documentation

## 2024-06-12 DIAGNOSIS — M7989 Other specified soft tissue disorders: Secondary | ICD-10-CM | POA: Diagnosis not present

## 2024-06-12 DIAGNOSIS — Z7984 Long term (current) use of oral hypoglycemic drugs: Secondary | ICD-10-CM | POA: Insufficient documentation

## 2024-06-12 DIAGNOSIS — R21 Rash and other nonspecific skin eruption: Secondary | ICD-10-CM | POA: Diagnosis present

## 2024-06-12 LAB — COMPREHENSIVE METABOLIC PANEL WITH GFR
ALT: 32 U/L (ref 0–44)
AST: 35 U/L (ref 15–41)
Albumin: 3.9 g/dL (ref 3.5–5.0)
Alkaline Phosphatase: 131 U/L — ABNORMAL HIGH (ref 38–126)
Anion gap: 15 (ref 5–15)
BUN: 30 mg/dL — ABNORMAL HIGH (ref 8–23)
CO2: 16 mmol/L — ABNORMAL LOW (ref 22–32)
Calcium: 8.5 mg/dL — ABNORMAL LOW (ref 8.9–10.3)
Chloride: 101 mmol/L (ref 98–111)
Creatinine, Ser: 1.64 mg/dL — ABNORMAL HIGH (ref 0.44–1.00)
GFR, Estimated: 34 mL/min — ABNORMAL LOW (ref >60)
Glucose, Bld: 305 mg/dL — ABNORMAL HIGH (ref 70–99)
Potassium: 5.2 mmol/L — ABNORMAL HIGH (ref 3.5–5.1)
Sodium: 132 mmol/L — ABNORMAL LOW (ref 135–145)
Total Bilirubin: <0.2 mg/dL (ref 0.0–1.2)
Total Protein: 7.2 g/dL (ref 6.5–8.1)

## 2024-06-12 LAB — CBC WITH DIFFERENTIAL/PLATELET
Abs Immature Granulocytes: 0.01 K/uL (ref 0.00–0.07)
Basophils Absolute: 0 K/uL (ref 0.0–0.1)
Basophils Relative: 0 %
Eosinophils Absolute: 0.1 K/uL (ref 0.0–0.5)
Eosinophils Relative: 2 %
HCT: 32.7 % — ABNORMAL LOW (ref 36.0–46.0)
Hemoglobin: 11.2 g/dL — ABNORMAL LOW (ref 12.0–15.0)
Immature Granulocytes: 0 %
Lymphocytes Relative: 28 %
Lymphs Abs: 1.9 K/uL (ref 0.7–4.0)
MCH: 29.4 pg (ref 26.0–34.0)
MCHC: 34.3 g/dL (ref 30.0–36.0)
MCV: 85.8 fL (ref 80.0–100.0)
Monocytes Absolute: 0.6 K/uL (ref 0.1–1.0)
Monocytes Relative: 8 %
Neutro Abs: 4.3 K/uL (ref 1.7–7.7)
Neutrophils Relative %: 62 %
Platelets: 373 K/uL (ref 150–400)
RBC: 3.81 MIL/uL — ABNORMAL LOW (ref 3.87–5.11)
RDW: 13.8 % (ref 11.5–15.5)
WBC: 6.9 K/uL (ref 4.0–10.5)
nRBC: 0 % (ref 0.0–0.2)

## 2024-06-12 MED ORDER — FAMOTIDINE IN NACL 20-0.9 MG/50ML-% IV SOLN
20.0000 mg | Freq: Once | INTRAVENOUS | Status: AC
  Administered 2024-06-12: 20 mg via INTRAVENOUS
  Filled 2024-06-12: qty 50

## 2024-06-12 MED ORDER — APIXABAN 2.5 MG PO TABS
10.0000 mg | ORAL_TABLET | ORAL | Status: AC
  Administered 2024-06-12: 10 mg via ORAL
  Filled 2024-06-12: qty 4

## 2024-06-12 MED ORDER — SODIUM ZIRCONIUM CYCLOSILICATE 10 G PO PACK
10.0000 g | PACK | ORAL | Status: AC
  Administered 2024-06-12: 10 g via ORAL
  Filled 2024-06-12: qty 1

## 2024-06-12 NOTE — ED Provider Notes (Signed)
 [place] EMERGENCY DEPARTMENT AT [facility] Provider Note   CSN: [id number] Arrival date & time: 06/12/24  1732     Patient presents with: Rash   Kristi Avery is a 68 y.o. female.   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  Patient reports that she was started on rifampin for latent TB on 05/07/2024.  2 weeks ago started developing itching of her arms and legs.  Week ago started noticing redness and rash all over her arms and legs and on the right side of her torso.  Also in the past 2 days start developing swelling of her left leg and right arm.  Does have a history of a blood clot before but thinks that it was a superficial thrombophlebitis.  Denies any fevers or chills or systemic symptoms.  No chest pain or shortness of breath       Prior to Admission medications   Medication Sig Start Date End Date Taking? Authorizing Provider  alendronate (FOSAMAX) 70 MG tablet Take 70 mg by mouth every Sunday. Take with a full glass of water on an empty stomach.    [provider]  amLODipine  (NORVASC ) 5 MG tablet Take 1 tablet (5 mg total) by mouth daily. 06/16/23 04/11/24  Christobal Guadalajara, MD  aspirin  EC 81 MG tablet Take 1 tablet (81 mg total) by mouth daily. Swallow whole. 06/15/23   Christobal Guadalajara, MD  atorvastatin  (LIPITOR) 80 MG tablet Take 1 tablet (80 mg total) by mouth every evening. 06/14/23 04/11/24  Christobal Guadalajara, MD  Cholecalciferol (VITAMIN D3 PO) Take 1 capsule by mouth daily.    [provider]  Cyanocobalamin (VITAMIN B12 PO) Take 1 tablet by mouth daily.    [provider]  cyclobenzaprine  (FLEXERIL ) 5 MG tablet Take 5 mg by mouth 3 (three) times daily as needed for muscle spasms.    [provider]  irbesartan  (AVAPRO ) 300 MG tablet Take 300 mg by mouth daily.    [provider]  metFORMIN  (GLUCOPHAGE ) 1000 MG tablet Take  500 mg by mouth daily with breakfast.    [provider]  pantoprazole  (PROTONIX ) 40 MG tablet Take 1 tablet (40 mg total) by mouth daily. (Take this in place of omeprazole  while on clopidogrel ) 06/14/23   Christobal Guadalajara, MD  simvastatin  (ZOCOR ) 40 MG tablet Take 40 mg by mouth daily. 01/20/24   [provider]  TALTZ 80 MG/ML SOAJ Inject 80 mg into the skin every 28 (twenty-eight) days.    [provider]    Allergies: Tramadol and Sulfonamide derivatives    Review of Systems  Updated Vital Signs BP (!) 154/62   Pulse 74   Temp 97.9 F (36.6 C)   Resp 18   SpO2 100%   Physical Exam Constitutional:      Appearance: Normal appearance.  HENT:     Head: Normocephalic and atraumatic.     Mouth/Throat:     Mouth: Mucous membranes are moist.     Pharynx: Oropharynx is clear.  Cardiovascular:     Rate and Rhythm: Normal rate and regular rhythm.     Heart sounds: No murmur heard.    No friction rub.  Pulmonary:     Effort: No respiratory distress.     Breath sounds: No stridor. No wheezing.  Musculoskeletal:     Right lower leg: No edema.     Left lower leg: Edema  present.     Comments: DP pulses 2+ bilaterally.  Right upper extremity with swelling of the entire extremity.  Radial pulses 2+ bilaterally.  Skin:    Findings: Rash (Maculopapular with areas of confluence.  Blanching.  Overlying scaly lesions as well. No desquamation. Nikolski negative.) present.  Neurological:     Mental Status: She is alert.          (all labs ordered are listed, but only abnormal results are displayed) Labs Reviewed  CBC WITH DIFFERENTIAL/PLATELET - Abnormal; Notable for the following components:      Result Value   RBC 3.81 (*)    Hemoglobin 11.2 (*)    HCT 32.7 (*)    All other components within normal limits  COMPREHENSIVE METABOLIC PANEL WITH GFR - Abnormal; Notable for the following components:   Sodium 132 (*)    Potassium 5.2 (*)    CO2 16 (*)    Glucose,  Bld 305 (*)    BUN 30 (*)    Creatinine, Ser 1.64 (*)    Calcium  8.5 (*)    Alkaline Phosphatase 131 (*)    GFR, Estimated 34 (*)    All other components within normal limits    EKG: None  Radiology: No results found.   Procedures   Medications Ordered in the ED  sodium zirconium cyclosilicate  (LOKELMA ) packet 10 g (10 g Oral Given 06/12/24 1933)  famotidine  (PEPCID ) IVPB 20 mg premix (0 mg Intravenous Stopped 06/12/24 2041)  apixaban  (ELIQUIS ) tablet 10 mg (10 mg Oral Given 06/12/24 2040)    Clinical Course as of 06/12/24 2222  Wed Jun 12, 2024  1932 Dr Leetta from [facility] consulted. Scheduler will call tomorrow.  [RP]  2018 [facility] who recommends 10 mg of eliquis  at this point [RP]  2041 Discussed with Dr. Dea who feels that the patient's stop her rifampin at this point in time.  Since his latent TB does not need to started on isoniazid and can follow-up with her outpatient infectious disease team to see how they would like to approach this [RP]    Clinical Course User Index [RP] Yolande Lamar BROCKS, MD                                 Medical Decision Making Amount and/or Complexity of Data Reviewed Labs: ordered.  Risk Prescription drug management.   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   Initial Ddx:  Drug eruption, erythema multiforme, SJS/TN, psoriasis, cellulitis, DVT, PE  MDM/Course:  Patient presents emergency department with rash after starting rifampin.  Is diffuse but mostly on her arms and legs.  Does not appear to be in the mucous membranes.  Is not desquamating.  She is not having systemic symptoms.  Was discussed with dermatology who felt that SJS/TN was highly unlikely.  Will continue her topical steroids since her blood sugar is high and systemic steroids would likely worsen her hyperglycemia.  Not currently in DKA.   Discussed with infectious disease who wants to hold her rifampin and have her talk to her outpatient doctor about other treatment options such as isoniazid monotherapy.  They felt that since it is latent TB she does not need any emergent treatment.  Also was found to have mild non-anion gap metabolic acidosis which has been present in the past and also  mild hyperkalemia and was given Lokelma .  Is having swelling of her left leg and right arm.  Concerned about possible DVT but unfortunately we do not have ultrasound.  She was given a dose of Eliquis  and ordered for an outpatient ultrasound tomorrow.  This patient presents to the ED for concern of complaints listed in HPI, this involves an extensive number of treatment options, and is a complaint that carries with it a high risk of complications and morbidity. Disposition including potential need for admission considered.   Dispo: DC Home. Return precautions discussed including, but not limited to, those listed in the AVS. Allowed pt time to ask questions which were answered fully prior to dc.  Additional history obtained from spouse Records reviewed Outpatient Clinic Notes The following labs were independently interpreted: Chemistry and show CKD I personally reviewed and interpreted cardiac monitoring: normal sinus rhythm  I personally reviewed and interpreted the pt's EKG: see above for interpretation  I have reviewed the patients home medications and made adjustments as needed Consults: Dermatology and infectious disease Social Determinants of health:  Geriatric  Portions of this note were generated with Scientist, clinical (histocompatibility and immunogenetics). Dictation errors may occur despite best attempts at proofreading.     Final diagnoses:  Drug rash  Leg swelling    ED Discharge Orders          Ordered    US  Venous Img Lower Unilateral Left        06/12/24 2041    US  Venous Img Upper Uni Right        06/12/24 2041               Yolande Lamar BROCKS,  MD 06/12/24 2222

## 2024-06-12 NOTE — Discharge Instructions (Addendum)
 You were seen for your rash and swelling in the emergency department.   At home, please continue your clobetasol  cream.  Take Zyrtec during the day for your rash and Benadryl  at night.  Stop taking rifampin and talk to your infectious disease doctors about next steps to treat your latent TB.  Check your MyChart online for the results of any tests that had not resulted by the time you left the emergency department.   Follow-up with your primary doctor in 2-3 days regarding your visit.  Follow-up with your infectious disease doctors.  The dermatologist from [facility] will call you about an appointment.  Come back tomorrow for your ultrasound.  Return immediately to the emergency department if you experience any of the following: Worsening rash, rash in your mouth, fevers, or any other concerning symptoms.    Thank you for visiting our Emergency Department. It was a pleasure taking care of you today.

## 2024-06-12 NOTE — ED Triage Notes (Signed)
 States was taking Rifampin. Red area on bilateral arms and legs. States area is super scratchy.

## 2024-06-12 NOTE — Telephone Encounter (Signed)
 I received a call from the PAL line regarding the following patient today. The provider (Dr. Yolande) was from [facility]. The HPI and physical exam findings as listed below are based on my conversation with the provider. The patient is a 68 yo female with h/o psoriasis and latent TB being evaluated for new onset rash in the setting of rifampin. Patient was seen by Doyal on 02/2024 with plans to restart Cosentyx for which she was off therapy for >1 year due to insurance issues. Quant gold revealed latent TB and she has been undergoin treatment for this with ID. About 2 weeks into treatment she began developing a secondary rash, different from her typical psoriasis that is very widespread and itchy, associated with skin peeling and some swelling of the extremities. They have consulted us  for concern over SJS and management recommendations.  I discussed with treating physician that at this time we are not able to offer virtual consults or direct advice/recommendations to outside facilities, but I am able to view pictures and discuss general guidance.  Suggestions as follows: Unlikely to be SJS as the rash is itchy and has no reported skin sloughing Consider erythrodermic psoriasis vs drug eruption 2/2 rifampin (based on description - unable to see actual photos) May want to consider isoniazid monotherapy as an alternative to rifamipin Can consider prednisone taper if appropriate with other medical conditions Continue topical steroids, consider hydroxyzine nightly if sleep disturbance is an issue Will arrange for more urgent follow up with dermatology, as she may need to start another systemic med such as methotrexate or cyclosporine while awaiting PA for Cosentyx (for which she was approved to re-start by ID)   They were advised to contact us  if they have any additional questions/concerns. Provider verbalized understanding and is in agreement with the plan.   I have discussed the  patient with my attending, Dr. Jerrell, and he is in agreement with the plan above.

## 2024-06-15 ENCOUNTER — Ambulatory Visit (HOSPITAL_BASED_OUTPATIENT_CLINIC_OR_DEPARTMENT_OTHER)
Admission: RE | Admit: 2024-06-15 | Discharge: 2024-06-15 | Disposition: A | Discharge status: Home / Self Care | Source: Ambulatory Visit | Attending: Emergency Medicine | Admitting: Emergency Medicine

## 2024-06-15 DIAGNOSIS — R600 Localized edema: Secondary | ICD-10-CM | POA: Diagnosis present

## 2024-06-15 DIAGNOSIS — I82502 Chronic embolism and thrombosis of unspecified deep veins of left lower extremity: Secondary | ICD-10-CM | POA: Insufficient documentation

## 2024-06-28 ENCOUNTER — Other Ambulatory Visit (INDEPENDENT_AMBULATORY_CARE_PROVIDER_SITE_OTHER)

## 2024-06-28 ENCOUNTER — Encounter: Payer: Self-pay | Admitting: Gastroenterology

## 2024-06-28 ENCOUNTER — Ambulatory Visit (INDEPENDENT_AMBULATORY_CARE_PROVIDER_SITE_OTHER): Admitting: Gastroenterology

## 2024-06-28 VITALS — BP 140/70 | HR 80 | Ht 61.0 in | Wt 128.0 lb

## 2024-06-28 DIAGNOSIS — D649 Anemia, unspecified: Secondary | ICD-10-CM

## 2024-06-28 DIAGNOSIS — K219 Gastro-esophageal reflux disease without esophagitis: Secondary | ICD-10-CM

## 2024-06-28 DIAGNOSIS — K2100 Gastro-esophageal reflux disease with esophagitis, without bleeding: Secondary | ICD-10-CM

## 2024-06-28 DIAGNOSIS — Z8711 Personal history of peptic ulcer disease: Secondary | ICD-10-CM

## 2024-06-28 DIAGNOSIS — R195 Other fecal abnormalities: Secondary | ICD-10-CM

## 2024-06-28 LAB — CBC WITH DIFFERENTIAL/PLATELET
Basophils Absolute: 0.1 K/uL (ref 0.0–0.1)
Basophils Relative: 0.6 % (ref 0.0–3.0)
Eosinophils Absolute: 0 K/uL (ref 0.0–0.7)
Eosinophils Relative: 0.5 % (ref 0.0–5.0)
HCT: 34.4 % — ABNORMAL LOW (ref 36.0–46.0)
Hemoglobin: 11.3 g/dL — ABNORMAL LOW (ref 12.0–15.0)
Lymphocytes Relative: 32.4 % (ref 12.0–46.0)
Lymphs Abs: 2.5 K/uL (ref 0.7–4.0)
MCHC: 33 g/dL (ref 30.0–36.0)
MCV: 88.2 fl (ref 78.0–100.0)
Monocytes Absolute: 0.5 K/uL (ref 0.1–1.0)
Monocytes Relative: 6.7 % (ref 3.0–12.0)
Neutro Abs: 4.7 K/uL (ref 1.4–7.7)
Neutrophils Relative %: 59.8 % (ref 43.0–77.0)
Platelets: 432 K/uL — ABNORMAL HIGH (ref 150.0–400.0)
RBC: 3.9 Mil/uL (ref 3.87–5.11)
RDW: 14 % (ref 11.5–15.5)
WBC: 7.8 K/uL (ref 4.0–10.5)

## 2024-06-28 LAB — B12 AND FOLATE PANEL
Folate: 12.9 ng/mL (ref >5.9)
Vitamin B-12: >1500 pg/mL — ABNORMAL HIGH (ref 211–911)

## 2024-06-28 LAB — COMPREHENSIVE METABOLIC PANEL WITH GFR
ALT: 10 U/L (ref 0–35)
AST: 12 U/L (ref 0–37)
Albumin: 4 g/dL (ref 3.5–5.2)
Alkaline Phosphatase: 87 U/L (ref 39–117)
BUN: 23 mg/dL (ref 6–23)
CO2: 22 meq/L (ref 19–32)
Calcium: 9.1 mg/dL (ref 8.4–10.5)
Chloride: 105 meq/L (ref 96–112)
Creatinine, Ser: 1.3 mg/dL — ABNORMAL HIGH (ref 0.40–1.20)
GFR: 42.41 mL/min — ABNORMAL LOW (ref >60.00)
Glucose, Bld: 113 mg/dL — ABNORMAL HIGH (ref 70–99)
Potassium: 5 meq/L (ref 3.5–5.1)
Sodium: 137 meq/L (ref 135–145)
Total Bilirubin: 0.3 mg/dL (ref 0.2–1.2)
Total Protein: 7.5 g/dL (ref 6.0–8.3)

## 2024-06-28 LAB — IBC + FERRITIN
Ferritin: 16.9 ng/mL (ref 10.0–291.0)
Iron: 69 ug/dL (ref 42–145)
Saturation Ratios: 22.8 % (ref 20.0–50.0)
TIBC: 302.4 ug/dL (ref 250.0–450.0)
Transferrin: 216 mg/dL (ref 212.0–360.0)

## 2024-06-28 MED ORDER — NA SULFATE-K SULFATE-MG SULF 17.5-3.13-1.6 GM/177ML PO SOLN: 1.0000 | ORAL | 0 refills | Status: AC

## 2024-06-28 NOTE — Progress Notes (Signed)
 Kristi Avery [id number] [birth year]   Chief Complaint: Anemia  Referring Provider: Onita Rush, MD Primary GI MD: Dr. Avram  HPI: Kristi Avery is a 68 y.o. female with past medical history of diabetes, gastric ulcer with hemorrhage 2013, GERD, HLD, HTN, CKD, latent TB, psoriasis, stroke 05/2023 who presents today for a complaint of anemia.    Patient seen in the ED 06/12/2024 for rash.  Noted to have history of psoriasis previously on biologics, diabetes, and latent TB, on rifampin.  Over 2 weeks had started developing itching of arms and legs with redness and rash all over as well as right side of her torso.  Dermatology consulted.  Pharmacy recommended 10 mg Eliquis .  Patient was advised to stop rifampin and follow-up with outpatient infectious disease.  Has since had follow-up with dermatology 06/14/2024.  Rash thought clinically to be more suggestive of flareup of psoriasis than a drug reaction.  Was started on methotrexate until she could be started on Cosentyx.  On recent labs patient has been found to have mild anemia with hemoglobin 11.7 on 05/06/2024, hemoglobin 11.2 on 06/12/2024. Heme positive stool.    Patient is referred to us  by her PCP due to worsening anemia and heme positive stool, for consideration of EGD/colonoscopy.  Patient is currently on Eliquis  and states she is only taking this for 3 more days as a precaution due to swelling in her left leg, though she was evaluated for this and found not to have a DVT.  She denies NSAID use.  States she is just taking Protonix  as needed as she has rare acid reflux or heartburn.  Since her stroke last year she has changed her eating habits and this has also helped with her reflux.  States she has lost about 10 pounds unintentionally, though has made lifestyle and dietary modifications.  She denies any nausea, vomiting, abdominal pain, fever or rectal pain.  Has a bowel movement daily and denies any diarrhea or constipation. Though she  has reportedly had heme positive stool she denies seeing any blood in her stool or melena, though states stool has been a orange/reddish-brown color on occasion, but color of her stool varies.  States that she is on vitamin B12 and folic acid  supplements.  Not taking any iron supplements.  Denies having her iron levels checked.  Denies MI/stroke since 05/2023.  Previous GI Procedures/Imaging   Colonoscopy 01/28/2016 - Internal hemorrhoids.  - The examination was otherwise normal on direct and retroflexion views.  - No specimens collected. - Recall 10 years  EGD 01/18/2013 Normal  Past Medical History:  Diagnosis Date   Abnormal Pap smear 10/24/1981   Anemia    Arthritis    Diabetes mellitus without complication (HCC)    Gastric ulcer with hemorrhage 09/01/2012   GERD (gastroesophageal reflux disease)    HLD (hyperlipidemia)    Hypertension    Insomnia    LTBI (latent tuberculosis infection)    Menorrhagia    Hx   Peripheral vascular disease (HCC)    Psoriasis    Ulcer     Past Surgical History:  Procedure Laterality Date   COLONOSCOPY  2007   ESOPHAGOGASTRODUODENOSCOPY  09/01/2012   Procedure: ESOPHAGOGASTRODUODENOSCOPY (EGD);  Surgeon: Lamar JONETTA Aho, MD;  Location: [facility];  Service: Endoscopy;  Laterality: N/A;   KIDNEY SURGERY     left   TUBAL LIGATION      Current Outpatient Medications  Medication Sig Dispense Refill   alendronate (FOSAMAX) 70 MG tablet  Take 70 mg by mouth every Sunday. Take with a full glass of water on an empty stomach.     amLODipine  (NORVASC ) 5 MG tablet Take 1 tablet (5 mg total) by mouth daily. 30 tablet 0   aspirin  EC 81 MG tablet Take 1 tablet (81 mg total) by mouth daily. Swallow whole. 120 tablet 3   Cholecalciferol (VITAMIN D3 PO) Take 1 capsule by mouth daily.     clobetasol  ointment (TEMOVATE ) 0.05 % Apply 1 Application topically 2 (two) times daily.     Cyanocobalamin  (VITAMIN B12 PO) Take 1 tablet by mouth daily.      cyclobenzaprine  (FLEXERIL ) 5 MG tablet Take 5 mg by mouth 3 (three) times daily as needed for muscle spasms.     ELIQUIS  DVT/PE STARTER PACK Take 1 tablet by mouth daily.     folic acid  (FOLVITE ) 1 MG tablet Take 1 mg by mouth daily.     irbesartan  (AVAPRO ) 300 MG tablet Take 300 mg by mouth daily.     metFORMIN  (GLUCOPHAGE ) 1000 MG tablet Take 500 mg by mouth daily with breakfast.     methotrexate (RHEUMATREX) 2.5 MG tablet Take 5 mg by mouth once a week.     pantoprazole  (PROTONIX ) 40 MG tablet Take 1 tablet (40 mg total) by mouth daily. (Take this in place of omeprazole  while on clopidogrel ) (Patient taking differently: Take 40 mg by mouth daily. (Take this in place of omeprazole  while on clopidogrel ) takes as needed) 21 tablet 0   No current facility-administered medications for this visit.    Allergies as of 06/28/2024 - Review Complete 06/28/2024  Allergen Reaction Noted   Tramadol Nausea And Vomiting and Other (See Comments) 09/01/2009   Rifampin Itching 06/28/2024   Sulfonamide derivatives Nausea And Vomiting 05/01/2008    Family History  Problem Relation Age of Onset   Diabetes Mother    Heart disease Mother    Stroke Father    Diabetes Sister    Diabetes Brother    Colon cancer Neg Hx    Esophageal cancer Neg Hx    Rectal cancer Neg Hx    Stomach cancer Neg Hx    Breast cancer Neg Hx     Social History   Tobacco Use   Smoking status: Every Day    Current packs/day: 0.50    Average packs/day: 0.5 packs/day for 35.0 years (17.5 ttl pk-yrs)    Types: Cigarettes   Smokeless tobacco: Never  Vaping Use   Vaping status: Never Used  Substance Use Topics   Alcohol  use: Not Currently    Alcohol /week: 2.0 - 3.0 standard drinks of alcohol     Types: 2 - 3 Glasses of wine per week   Drug use: No     Review of Systems:    Constitutional: Unintentional weight loss of 10 pounds (though may be explained by change in lifestyle and diet), no fever or chills Cardiovascular: No  chest pain Respiratory: No SOB Gastrointestinal: See HPI and otherwise negative   Physical Exam:  Vital signs: BP (!) 140/70   Pulse 80   Ht 5' 1 (1.549 m)   Wt 128 lb (58.1 kg)   BMI 24.19 kg/m   Wt Readings from Last 3 Encounters:  06/28/24 128 lb (58.1 kg)  04/11/24 130 lb 12.8 oz (59.3 kg)  01/18/24 129 lb 11.2 oz (58.8 kg)    Constitutional: Pleasant female in NAD, alert and cooperative Head:  Normocephalic and atraumatic.  Eyes: No scleral icterus.  Respiratory: Respirations  even and unlabored. Lungs clear to auscultation bilaterally.  No wheezes, crackles, or rhonchi.  Cardiovascular:  Regular rate and rhythm. No murmurs.  Minimal left lower extremity edema. Gastrointestinal:  Soft, nondistended, nontender. No rebound or guarding.  Sounds present.  An abdominal bruit is heard in upper abdomen, primarily in LUQ. No appreciable masses or hepatomegaly. Rectal:  Not performed.  Neurologic:  Alert and oriented x4;  grossly normal neurologically.  Skin:   Scaly plaques on arms and legs in setting of known psoriasis Psychiatric: Oriented to person, place and time. Demonstrates good judgement and reason without abnormal affect or behaviors.   RELEVANT LABS AND IMAGING: CBC    Component Value Date/Time   WBC 6.9 06/12/2024 1820   RBC 3.81 (L) 06/12/2024 1820   HGB 11.2 (L) 06/12/2024 1820   HCT 32.7 (L) 06/12/2024 1820   PLT 373 06/12/2024 1820   MCV 85.8 06/12/2024 1820   MCV 94.5 08/17/2013 1706   MCH 29.4 06/12/2024 1820   MCHC 34.3 06/12/2024 1820   RDW 13.8 06/12/2024 1820   LYMPHSABS 1.9 06/12/2024 1820   MONOABS 0.6 06/12/2024 1820   EOSABS 0.1 06/12/2024 1820   BASOSABS 0.0 06/12/2024 1820    CMP     Component Value Date/Time   NA 132 (L) 06/12/2024 1820   K 5.2 (H) 06/12/2024 1820   CL 101 06/12/2024 1820   CO2 16 (L) 06/12/2024 1820   GLUCOSE 305 (H) 06/12/2024 1820   BUN 30 (H) 06/12/2024 1820   CREATININE 1.64 (H) 06/12/2024 1820   CALCIUM  8.5  (L) 06/12/2024 1820   PROT 7.2 06/12/2024 1820   ALBUMIN 3.9 06/12/2024 1820   AST 35 06/12/2024 1820   ALT 32 06/12/2024 1820   ALKPHOS 131 (H) 06/12/2024 1820   BILITOT <0.2 06/12/2024 1820   GFRNONAA 34 (L) 06/12/2024 1820   GFRAA 70 (L) 09/02/2012 0516   Echocardiogram 06/14/2023 1. Left ventricular ejection fraction, by estimation, is 60 to 65% . The left ventricle has normal function. The left ventricle has no regional wall motion abnormalities. Left ventricular diastolic parameters are consistent with Grade I diastolic dysfunction ( impaired relaxation) .  2. Right ventricular systolic function is normal. The right ventricular size is normal. There is normal pulmonary artery systolic pressure. The estimated right ventricular systolic pressure is 23. 6 mmHg.  3. The mitral valve is grossly normal. Trivial mitral valve regurgitation. No evidence of mitral stenosis.  4. The aortic valve is tricuspid. Aortic valve regurgitation is not visualized. No aortic stenosis is present.  5. The inferior vena cava is normal in size with greater than 50% respiratory variability, suggesting right atrial pressure of 3 mmHg.  Assessment/Plan:   Anemia Heme positive stool GERD History of gastric ulcer with hemorrhage 2013 Patient seen today having been referred by her PCP with worsening anemia and heme positive stool, for consideration of EGD/colonoscopy.  Aside from occasionally noting a orange/reddish-brown color to her stool, patient denies any rectal bleeding or melena.  Denies any nausea, vomiting, abdominal pain, rectal pain.  Is having regular bowel movements and denies diarrhea or constipation.  She has history of gastric ulcer in 2013 with repeat EGD in 2014 normal.  She takes Protonix  on an as needed basis for rare acid reflux/heartburn and denies NSAID use. Colonoscopy 2017 with finding of internal hemorrhoids and otherwise normal 10-year recall. She has been on Eliquis  for the last couple  weeks due to concern for possible DVT, though this has been ruled out with  imaging and she has continued on it as a precaution due to left lower extremity edema.  States that she has 3 days left of Eliquis .  - Schedule EGD/colonoscopy. I thoroughly discussed the procedure with the patient to include nature of the procedure, alternatives, benefits, and risks (including but not limited to bleeding, infection, perforation, anesthesia/cardiac/pulmonary complications). Patient verbalized understanding and gave verbal consent to proceed with procedure.  - Labs today: CBC, CMP, iron/ferritin, B12, folate - Will formally request Eliquis  hold, though patient states she is to stop taking in a few days.  Abdominal bruit On exam an abdominal bruit is heard in the upper abdomen, loudest in LUQ.  I did have a colleague also confirm presence of bruit (Amy Esterwood, PA-C).  Patient denies any abdominal pain. On CT renal stone study 11/20/2023 there was finding of moderate calcified plaque along the aorta and branch vessels, normal caliber aorta and IVC.  Mild bilateral renal atrophy and some calcifications along both kidneys which could be more vascular than collecting system. Will discuss with Dr. Avram regarding further workup versus referral to PCP for further management.    Camie Furbish, PA-C [facility] 06/28/2024, 11:28 AM  Patient Care Team: Onita Rush, MD as PCP - General (Internal Medicine)

## 2024-06-28 NOTE — Patient Instructions (Addendum)
 Your provider has requested that you go to the basement level for lab work before leaving today. Press B on the elevator. The lab is located at the first door on the left as you exit the elevator.   We have sent the following medications to your pharmacy for you to pick up at your convenience: Suprep   You have been scheduled for an endoscopy and colonoscopy. Please follow the written instructions given to you at your visit today.  If you use inhalers (even only as needed), please bring them with you on the day of your procedure.  DO NOT TAKE 7 DAYS PRIOR TO TEST- Trulicity (dulaglutide) Ozempic, Wegovy (semaglutide) Mounjaro (tirzepatide) Bydureon Bcise (exanatide extended release)  DO NOT TAKE 1 DAY PRIOR TO YOUR TEST Rybelsus (semaglutide) Adlyxin (lixisenatide) Victoza (liraglutide) Byetta (exanatide) _____________________________________________________________________  Thank you for choosing me and [facility].  Camie Furbish, PA-C

## 2024-06-29 ENCOUNTER — Encounter: Payer: Self-pay | Admitting: Gastroenterology

## 2024-06-30 ENCOUNTER — Telehealth: Payer: Self-pay | Admitting: Gastroenterology

## 2024-06-30 DIAGNOSIS — R0989 Other specified symptoms and signs involving the circulatory and respiratory systems: Secondary | ICD-10-CM

## 2024-06-30 NOTE — Telephone Encounter (Signed)
 An abdominal bruit was heard on exam during patient's office visit with us . I discussed with Dr. Avram who has advised follow up with vascular surgery for further workup of this as she is already established there. Please refer patient to vascular surgery for abdominal bruit, thank you.

## 2024-07-01 ENCOUNTER — Ambulatory Visit: Payer: Self-pay | Admitting: Gastroenterology

## 2024-07-01 NOTE — Telephone Encounter (Signed)
 Referral has been made for VVS

## 2024-07-03 ENCOUNTER — Other Ambulatory Visit: Payer: Self-pay | Admitting: Vascular Surgery

## 2024-07-03 DIAGNOSIS — R0989 Other specified symptoms and signs involving the circulatory and respiratory systems: Secondary | ICD-10-CM

## 2024-07-09 NOTE — Progress Notes (Unsigned)
 Office Note     CC: Abdominal bruit Requesting Provider:  Arletta Camie BRAVO, PA-C  HPI: Kristi Avery is a 68 y.o. (1955-12-12) female presenting at the request of .Onita Rush, MD due to concern for abdominal bruit.  Patient is well-known to me with known PAD, with previous visit for right second toe wound which healed without intervention.    A native of [place] , she moved to [place] years ago with her husband who took a new position in trucking. She has 2 children, and 2 grandchildren. Her grandson is now sickle cell free after bone marrow transplant from his sister. 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 She continues to live an active, independent lifestyle.   The pt is *** on a statin for cholesterol management.  The pt is *** on a daily aspirin .   Other AC:  *** The pt is *** on medication for hypertension.   The pt is *** diabetic.  Tobacco hx:  ***  Past Medical History:  Diagnosis Date   Abnormal Pap smear 10/24/1981   Anemia    Arthritis    Diabetes mellitus without complication (HCC)    Gastric ulcer with hemorrhage 09/01/2012   GERD (gastroesophageal reflux disease)    HLD (hyperlipidemia)    Hypertension    Insomnia    LTBI (latent tuberculosis infection)    Menorrhagia    Hx   Peripheral vascular disease (HCC)    Psoriasis    Ulcer     Past Surgical History:  Procedure Laterality Date   COLONOSCOPY  2007   ESOPHAGOGASTRODUODENOSCOPY  09/01/2012   Procedure: ESOPHAGOGASTRODUODENOSCOPY (EGD);  Surgeon: Lamar JONETTA Aho, MD;  Location: [facility];  Service: Endoscopy;  Laterality: N/A;   KIDNEY SURGERY     left   TUBAL LIGATION      Social History   Socioeconomic History   Marital status: Married    Spouse name: Not on file   Number of children: 2   Years of education: Not on file   Highest education level: Not on file  Occupational History   Occupation: retired    Associate Professor: [employer]   Tobacco Use   Smoking status: Every Day    Current packs/day: 0.50    Average packs/day: 0.5 packs/day for 35.0 years (17.5 ttl pk-yrs)    Types: Cigarettes   Smokeless tobacco: Never  Vaping Use   Vaping status: Never Used  Substance and Sexual Activity   Alcohol  use: Not Currently    Alcohol /week: 2.0 - 3.0 standard drinks of alcohol     Types: 2 - 3 Glasses of wine per week   Drug use: No   Sexual activity: Yes    Birth control/protection: Surgical    Comment: BTL  Other Topics Concern   Not on file  Social History Narrative   Not on file   Social Drivers of Health   Financial Resource Strain: Not on file  Food Insecurity: Low Risk  (05/06/2024)   Received from [facility]   Hunger Vital Sign    Within the past 12 months, you worried that your food would run out before you got money to buy more: Never true    Within the past 12 months, the food you bought just didn't last and you didn't have money to get more. : Never true  Transportation Needs: No Transportation Needs (05/06/2024)   Received from [facility]    In the  past 12 months, has lack of reliable transportation kept you from medical appointments, meetings, work or from getting things needed for daily living? : No  Physical Activity: Not on file  Stress: Not on file  Social Connections: Not on file  Intimate Partner Violence: Not At Risk (06/13/2023)   Humiliation, Afraid, Rape, and Kick questionnaire    Fear of Current or Ex-Partner: No    Emotionally Abused: No    Physically Abused: No    Sexually Abused: No   *** Family History  Problem Relation Age of Onset   Diabetes Mother    Heart disease Mother    Stroke Father    Diabetes Sister    Diabetes Brother    Colon cancer Neg Hx    Esophageal cancer Neg Hx    Rectal cancer Neg Hx    Stomach cancer Neg Hx    Breast cancer Neg Hx     Current Outpatient Medications  Medication Sig Dispense Refill   alendronate (FOSAMAX) 70 MG  tablet Take 70 mg by mouth every Sunday. Take with a full glass of water on an empty stomach.     amLODipine  (NORVASC ) 5 MG tablet Take 1 tablet (5 mg total) by mouth daily. 30 tablet 0   aspirin  EC 81 MG tablet Take 1 tablet (81 mg total) by mouth daily. Swallow whole. 120 tablet 3   Cholecalciferol (VITAMIN D3 PO) Take 1 capsule by mouth daily.     clobetasol  ointment (TEMOVATE ) 0.05 % Apply 1 Application topically 2 (two) times daily.     Cyanocobalamin  (VITAMIN B12 PO) Take 1 tablet by mouth daily.     cyclobenzaprine  (FLEXERIL ) 5 MG tablet Take 5 mg by mouth 3 (three) times daily as needed for muscle spasms.     ELIQUIS  DVT/PE STARTER PACK Take 1 tablet by mouth daily.     folic acid  (FOLVITE ) 1 MG tablet Take 1 mg by mouth daily.     irbesartan  (AVAPRO ) 300 MG tablet Take 300 mg by mouth daily.     metFORMIN  (GLUCOPHAGE ) 1000 MG tablet Take 500 mg by mouth daily with breakfast.     methotrexate (RHEUMATREX) 2.5 MG tablet Take 5 mg by mouth once a week.     Na Sulfate-K Sulfate-Mg Sulfate concentrate (SUPREP) 17.5-3.13-1.6 GM/177ML SOLN Take 1 kit (354 mLs total) by mouth as directed. 354 mL 0   pantoprazole  (PROTONIX ) 40 MG tablet Take 1 tablet (40 mg total) by mouth daily. (Take this in place of omeprazole  while on clopidogrel ) (Patient taking differently: Take 40 mg by mouth daily. (Take this in place of omeprazole  while on clopidogrel ) takes as needed) 21 tablet 0   No current facility-administered medications for this visit.    Allergies  Allergen Reactions   Tramadol Nausea And Vomiting and Other (See Comments)    GI Intolerance   Rifampin Itching   Sulfonamide Derivatives Nausea And Vomiting     REVIEW OF SYSTEMS:  *** [X]  denotes positive finding, [ ]  denotes negative finding Cardiac  Comments:  Chest pain or chest pressure:    Shortness of breath upon exertion:    Short of breath when lying flat:    Irregular heart rhythm:        Vascular    Pain in calf, thigh, or  hip brought on by ambulation:    Pain in feet at night that wakes you up from your sleep:     Blood clot in your veins:    Leg swelling:  Pulmonary    Oxygen at home:    Productive cough:     Wheezing:         Neurologic    Sudden weakness in arms or legs:     Sudden numbness in arms or legs:     Sudden onset of difficulty speaking or slurred speech:    Temporary loss of vision in one eye:     Problems with dizziness:         Gastrointestinal    Blood in stool:     Vomited blood:         Genitourinary    Burning when urinating:     Blood in urine:        Psychiatric    Major depression:         Hematologic    Bleeding problems:    Problems with blood clotting too easily:        Skin    Rashes or ulcers:        Constitutional    Fever or chills:      PHYSICAL EXAMINATION:  There were no vitals filed for this visit.  General:  WDWN in NAD; vital signs documented above Gait: Not observed HENT: WNL, normocephalic Pulmonary: normal non-labored breathing , without wheezing Cardiac: {Desc; regular/irreg:14544} HR Abdomen: soft, NT, no masses Skin: {With/Without:20273} rashes Vascular Exam/Pulses:  Right Left  Radial {Exam; arterial pulse strength 0-4:30167} {Exam; arterial pulse strength 0-4:30167}  Ulnar {Exam; arterial pulse strength 0-4:30167} {Exam; arterial pulse strength 0-4:30167}  Femoral {Exam; arterial pulse strength 0-4:30167} {Exam; arterial pulse strength 0-4:30167}  Popliteal {Exam; arterial pulse strength 0-4:30167} {Exam; arterial pulse strength 0-4:30167}  DP {Exam; arterial pulse strength 0-4:30167} {Exam; arterial pulse strength 0-4:30167}  PT {Exam; arterial pulse strength 0-4:30167} {Exam; arterial pulse strength 0-4:30167}   Extremities: {With/Without:20273} ischemic changes, {With/Without:20273} Gangrene , {With/Without:20273} cellulitis; {With/Without:20273} open wounds;  Musculoskeletal: no muscle wasting or atrophy  Neurologic:  A&O X 3;  No focal weakness or paresthesias are detected Psychiatric:  The pt has {Desc; normal/abnormal:11317::Normal} affect.   Non-Invasive Vascular Imaging:   ***    ASSESSMENT/PLAN: ANAJULIA LEYENDECKER is a 69 y.o. female presenting with ***   ***   Kristi FORBES Rim, MD [facility] [phone]

## 2024-07-12 ENCOUNTER — Other Ambulatory Visit (HOSPITAL_COMMUNITY): Payer: Self-pay

## 2024-07-12 ENCOUNTER — Encounter: Payer: Self-pay | Admitting: Vascular Surgery

## 2024-07-12 ENCOUNTER — Ambulatory Visit (INDEPENDENT_AMBULATORY_CARE_PROVIDER_SITE_OTHER): Admitting: Vascular Surgery

## 2024-07-12 ENCOUNTER — Ambulatory Visit (HOSPITAL_COMMUNITY)
Admission: RE | Admit: 2024-07-12 | Discharge: 2024-07-12 | Disposition: A | Discharge status: Home / Self Care | Source: Ambulatory Visit | Attending: Vascular Surgery | Admitting: Vascular Surgery

## 2024-07-12 VITALS — BP 155/77 | HR 71 | Temp 97.7°F | Resp 18 | Ht 61.0 in | Wt 129.8 lb

## 2024-07-12 DIAGNOSIS — R0989 Other specified symptoms and signs involving the circulatory and respiratory systems: Secondary | ICD-10-CM | POA: Diagnosis present

## 2024-07-12 DIAGNOSIS — K551 Chronic vascular disorders of intestine: Secondary | ICD-10-CM

## 2024-07-12 MED ORDER — ATORVASTATIN CALCIUM 80 MG PO TABS
80.0000 mg | ORAL_TABLET | Freq: Every day | ORAL | 3 refills | Status: AC
  Filled 2024-07-12: qty 100, 100d supply, fill #0
  Filled 2024-10-21: qty 100, 100d supply, fill #1

## 2024-07-15 ENCOUNTER — Other Ambulatory Visit: Payer: Self-pay

## 2024-07-15 DIAGNOSIS — D649 Anemia, unspecified: Secondary | ICD-10-CM

## 2024-07-15 NOTE — Telephone Encounter (Signed)
 Attempted to reach pt to discuss coming in for repeat CBC. No answer. Left vm for patient to return call.

## 2024-07-18 ENCOUNTER — Other Ambulatory Visit (INDEPENDENT_AMBULATORY_CARE_PROVIDER_SITE_OTHER)

## 2024-07-18 DIAGNOSIS — D649 Anemia, unspecified: Secondary | ICD-10-CM

## 2024-07-18 LAB — CBC WITH DIFFERENTIAL/PLATELET
Basophils Absolute: 0 K/uL (ref 0.0–0.1)
Basophils Relative: 0.5 % (ref 0.0–3.0)
Eosinophils Absolute: 0.1 K/uL (ref 0.0–0.7)
Eosinophils Relative: 1.5 % (ref 0.0–5.0)
HCT: 34.9 % — ABNORMAL LOW (ref 36.0–46.0)
Hemoglobin: 11.8 g/dL — ABNORMAL LOW (ref 12.0–15.0)
Lymphocytes Relative: 29.4 % (ref 12.0–46.0)
Lymphs Abs: 1.9 K/uL (ref 0.7–4.0)
MCHC: 33.7 g/dL (ref 30.0–36.0)
MCV: 87.7 fl (ref 78.0–100.0)
Monocytes Absolute: 0.4 K/uL (ref 0.1–1.0)
Monocytes Relative: 7 % (ref 3.0–12.0)
Neutro Abs: 3.9 K/uL (ref 1.4–7.7)
Neutrophils Relative %: 61.6 % (ref 43.0–77.0)
Platelets: 457 K/uL — ABNORMAL HIGH (ref 150.0–400.0)
RBC: 3.98 Mil/uL (ref 3.87–5.11)
RDW: 14.3 % (ref 11.5–15.5)
WBC: 6.4 K/uL (ref 4.0–10.5)

## 2024-07-19 ENCOUNTER — Ambulatory Visit: Payer: Self-pay | Admitting: Gastroenterology

## 2024-07-29 NOTE — Telephone Encounter (Signed)
 Patient requesting to speak with a nurse in regards to medications. Please advise.   Thank you

## 2024-07-30 ENCOUNTER — Telehealth: Payer: Self-pay

## 2024-07-30 NOTE — Telephone Encounter (Signed)
 Recd letter back from Dr Onita stating that patient is no longer taking Eliquis . Called and confirmed with patient. She states that it was stopped 2 weeks ago. I have called and requested last visit note from Dr Onita office.

## 2024-08-01 ENCOUNTER — Encounter: Payer: Self-pay | Admitting: Internal Medicine

## 2024-08-07 NOTE — Progress Notes (Unsigned)
 [facility] History and Physical   Primary Care Physician:  Onita Rush, MD   Reason for Procedure:    Encounter Diagnoses  Name Primary?   Heme positive stool Yes   Anemia, unspecified type      Plan:    EGD and colonoscopy     HPI: Kristi Avery is a 68 y.o. female seen in the clinic in early September at the request of Dr. Onita because of heme positive stool and worsening anemia.  She had been on Eliquis  but this has been discontinued per plan.  There were no visible signs of GI bleeding.     Latest Ref Rng & Units 07/18/2024    9:04 AM 06/28/2024   12:34 PM 06/12/2024    6:20 PM  CBC  WBC 4.0 - 10.5 K/uL 6.4  7.8  6.9   Hemoglobin 12.0 - 15.0 g/dL 88.1  88.6  88.7   Hematocrit 36.0 - 46.0 % 34.9  34.4  32.7   Platelets 150.0 - 400.0 K/uL 457.0  432.0  373     Lab Results  Component Value Date   IRON 69 06/28/2024   TIBC 302.4 06/28/2024   FERRITIN 16.9 06/28/2024   Lab Results  Component Value Date   VITAMINB12 >1500 (H) 06/28/2024     Colonoscopy 01/28/2016 - Internal hemorrhoids.  - The examination was otherwise normal on direct and retroflexion views.  - No specimens collected. - Recall 10 years   EGD 01/18/2013 Normal  2013 EGD gastric ulcer Past Medical History:  Diagnosis Date   Abnormal Pap smear 10/24/1981   Anemia    Arthritis    Diabetes mellitus without complication (HCC)    Gastric ulcer with hemorrhage 09/01/2012   GERD (gastroesophageal reflux disease)    HLD (hyperlipidemia)    Hypertension    Insomnia    LTBI (latent tuberculosis infection)    Menorrhagia    Hx   Peripheral vascular disease    Psoriasis    Ulcer     Past Surgical History:  Procedure Laterality Date   COLONOSCOPY  2007   ESOPHAGOGASTRODUODENOSCOPY  09/01/2012   Procedure: ESOPHAGOGASTRODUODENOSCOPY (EGD);  Surgeon: Lamar JONETTA Aho, MD;  Location: [facility];  Service: Endoscopy;  Laterality: N/A;   KIDNEY SURGERY     left   TUBAL LIGATION        Current Outpatient Medications  Medication Sig Dispense Refill   alendronate (FOSAMAX) 70 MG tablet Take 70 mg by mouth every Sunday. Take with a full glass of water on an empty stomach.     amLODipine  (NORVASC ) 5 MG tablet Take 1 tablet (5 mg total) by mouth daily. 30 tablet 0   aspirin  EC 81 MG tablet Take 1 tablet (81 mg total) by mouth daily. Swallow whole. 120 tablet 3   atorvastatin  (LIPITOR) 80 MG tablet Take 1 tablet (80 mg total) by mouth daily. 100 tablet 3   Cholecalciferol (VITAMIN D3 PO) Take 1 capsule by mouth daily.     clobetasol  ointment (TEMOVATE ) 0.05 % Apply 1 Application topically 2 (two) times daily.     Cyanocobalamin  (VITAMIN B12 PO) Take 1 tablet by mouth daily.     cyclobenzaprine  (FLEXERIL ) 5 MG tablet Take 5 mg by mouth 3 (three) times daily as needed for muscle spasms.     irbesartan  (AVAPRO ) 300 MG tablet Take 300 mg by mouth daily.     metFORMIN  (GLUCOPHAGE ) 1000 MG tablet Take 500 mg by mouth daily with breakfast.  Na Sulfate-K Sulfate-Mg Sulfate concentrate (SUPREP) 17.5-3.13-1.6 GM/177ML SOLN Take 1 kit (354 mLs total) by mouth as directed. 354 mL 0   pantoprazole  (PROTONIX ) 40 MG tablet Take 1 tablet (40 mg total) by mouth daily. (Take this in place of omeprazole  while on clopidogrel ) (Patient taking differently: Take 40 mg by mouth daily. (Take this in place of omeprazole  while on clopidogrel ) takes as needed) 21 tablet 0   No current facility-administered medications for this visit.    Allergies as of 08/08/2024 - Review Complete 07/12/2024  Allergen Reaction Noted   Tramadol Nausea And Vomiting and Other (See Comments) 09/01/2009   Rifampin Itching 06/28/2024   Sulfonamide derivatives Nausea And Vomiting 05/01/2008    Family History  Problem Relation Age of Onset   Diabetes Mother    Heart disease Mother    Stroke Father    Diabetes Sister    Diabetes Brother    Colon cancer Neg Hx    Esophageal cancer Neg Hx    Rectal cancer Neg Hx     Stomach cancer Neg Hx    Breast cancer Neg Hx     Social History   Socioeconomic History   Marital status: Married    Spouse name: Not on file   Number of children: 2   Years of education: Not on file   Highest education level: Not on file  Occupational History   Occupation: retired    Associate Professor: [employer]  Tobacco Use   Smoking status: Every Day    Current packs/day: 0.50    Average packs/day: 0.5 packs/day for 35.0 years (17.5 ttl pk-yrs)    Types: Cigarettes   Smokeless tobacco: Never  Vaping Use   Vaping status: Never Used  Substance and Sexual Activity   Alcohol  use: Not Currently    Alcohol /week: 2.0 - 3.0 standard drinks of alcohol     Types: 2 - 3 Glasses of wine per week   Drug use: No   Sexual activity: Yes    Birth control/protection: Surgical    Comment: BTL  Other Topics Concern   Not on file  Social History Narrative   Not on file   Social Drivers of Health   Financial Resource Strain: Not on file  Food Insecurity: Low Risk  (05/06/2024)   Received from [facility]   Hunger Vital Sign    Within the past 12 months, you worried that your food would run out before you got money to buy more: Never true    Within the past 12 months, the food you bought just didn't last and you didn't have money to get more. : Never true  Transportation Needs: No Transportation Needs (05/06/2024)   Received from [facility]    In the past 12 months, has lack of reliable transportation kept you from medical appointments, meetings, work or from getting things needed for daily living? : No  Physical Activity: Not on file  Stress: Not on file  Social Connections: Not on file  Intimate Partner Violence: Not At Risk (06/13/2023)   Humiliation, Afraid, Rape, and Kick questionnaire    Fear of Current or Ex-Partner: No    Emotionally Abused: No    Physically Abused: No    Sexually Abused: No    Review of Systems: Positive for *** All  other review of systems negative except as mentioned in the HPI.  Physical Exam: Vital signs There were no vitals taken for this visit.  General:   Alert,  Well-developed, well-nourished, pleasant and cooperative in NAD Lungs:  Clear throughout to auscultation.   Heart:  Regular rate and rhythm; no murmurs, clicks, rubs,  or gallops. Abdomen:  Soft, nontender and nondistended. Normal bowel sounds.   Neuro/Psych:  Alert and cooperative. Normal mood and affect. A and O x 3   @Daziyah Cogan  CHARLENA Commander, MD, [facility] [phone] (pager) 08/07/2024 10:09 PM@

## 2024-08-08 ENCOUNTER — Ambulatory Visit: Admitting: Internal Medicine

## 2024-08-08 ENCOUNTER — Encounter: Payer: Self-pay | Admitting: Internal Medicine

## 2024-08-08 VITALS — BP 127/46 | HR 61 | Temp 97.9°F | Resp 16 | Ht 61.0 in | Wt 128.0 lb

## 2024-08-08 DIAGNOSIS — K648 Other hemorrhoids: Secondary | ICD-10-CM

## 2024-08-08 DIAGNOSIS — K644 Residual hemorrhoidal skin tags: Secondary | ICD-10-CM | POA: Diagnosis not present

## 2024-08-08 DIAGNOSIS — R195 Other fecal abnormalities: Secondary | ICD-10-CM

## 2024-08-08 DIAGNOSIS — K449 Diaphragmatic hernia without obstruction or gangrene: Secondary | ICD-10-CM

## 2024-08-08 DIAGNOSIS — D649 Anemia, unspecified: Secondary | ICD-10-CM

## 2024-08-08 DIAGNOSIS — K2970 Gastritis, unspecified, without bleeding: Secondary | ICD-10-CM | POA: Diagnosis not present

## 2024-08-08 DIAGNOSIS — D509 Iron deficiency anemia, unspecified: Secondary | ICD-10-CM

## 2024-08-08 DIAGNOSIS — K3189 Other diseases of stomach and duodenum: Secondary | ICD-10-CM | POA: Diagnosis not present

## 2024-08-08 MED ORDER — SODIUM CHLORIDE 0.9 % IV SOLN: 500.0000 mL | INTRAVENOUS | Status: DC

## 2024-08-08 MED ORDER — FERROUS SULFATE 325 (65 FE) MG PO TABS: 325.0000 mg | ORAL_TABLET | Freq: Every day | ORAL | Status: AC

## 2024-08-08 NOTE — Progress Notes (Signed)
0849 Robinul 0.1 mg IV given due large amount of secretions upon assessment.  MD made aware, vss  

## 2024-08-08 NOTE — Progress Notes (Signed)
 Report given to PACU, vss

## 2024-08-08 NOTE — Op Note (Signed)
 [facility] Patient Name: Kendria Halberg Procedure Date: 08/08/2024 8:48 AM MRN: [id number] Endoscopist: Lupita FORBES Commander , MD, [id number] Age: 68 Referring MD:  Date of Birth: 1956/07/20 Gender: Female Account #: [id number] Procedure:                Upper GI endoscopy Indications:              Iron deficiency anemia, Heme positive stool Medicines:                Monitored Anesthesia Care Procedure:                Pre-Anesthesia Assessment:                           - Prior to the procedure, a History and Physical                            was performed, and patient medications and                            allergies were reviewed. The patient's tolerance of                            previous anesthesia was also reviewed. The risks                            and benefits of the procedure and the sedation                            options and risks were discussed with the patient.                            All questions were answered, and informed consent                            was obtained. Prior Anticoagulants: The patient has                            taken no anticoagulant or antiplatelet agents. ASA                            Grade Assessment: III - A patient with severe                            systemic disease. After reviewing the risks and                            benefits, the patient was deemed in satisfactory                            condition to undergo the procedure.                           After obtaining informed consent, the endoscope was  passed under direct vision. Throughout the                            procedure, the patient's blood pressure, pulse, and                            oxygen saturations were monitored continuously. The                            GIF [id number] #[id number] was introduced through the                            mouth, and advanced to the second part of duodenum.                            The upper  GI endoscopy was accomplished without                            difficulty. The patient tolerated the procedure                            well. Scope In: Scope Out: Findings:                 Patchy mild inflammation characterized by                            congestion (edema), erosions and erythema was found                            in the gastric antrum. Biopsies were taken with a                            cold forceps for histology. Verification of patient                            identification for the specimen was done. Estimated                            blood loss was minimal.                           A 3 cm hiatal hernia was present.                           The gastroesophageal flap valve was visualized                            endoscopically and classified as Hill Grade IV (no                            fold, wide open lumen, hiatal hernia present).                           The exam was otherwise without abnormality.  The cardia and gastric fundus were normal on                            retroflexion. Complications:            No immediate complications. Estimated Blood Loss:     Estimated blood loss was minimal. Impression:               - Gastritis. Biopsied.                           - 3 cm hiatal hernia.                           - Gastroesophageal flap valve classified as Hill                            Grade IV (no fold, wide open lumen, hiatal hernia                            present).                           - The examination was otherwise normal. Recommendation:           - Patient has a contact number available for                            emergencies. The signs and symptoms of potential                            delayed complications were discussed with the                            patient. Return to normal activities tomorrow.                            Written discharge instructions were provided to the                             patient.                           - Resume previous diet.                           - Continue present medications.                           - Await pathology results.                           - See the other procedure note for documentation of                            additional recommendations. Lupita FORBES Commander, MD 08/08/2024 9:40:45 AM This report has been signed electronically.

## 2024-08-08 NOTE — Progress Notes (Signed)
 Wt Readings from Last 3 Encounters:  08/08/24 128 lb (58.1 kg)  07/12/24 129 lb 12.8 oz (58.9 kg)  06/28/24 128 lb (58.1 kg)

## 2024-08-08 NOTE — Progress Notes (Signed)
 Called to room to assist during endoscopic procedure.  Patient ID and intended procedure confirmed with present staff. Received instructions for my participation in the procedure from the performing physician.

## 2024-08-08 NOTE — Op Note (Signed)
 [facility] Patient Name: Kristi Avery Procedure Date: 08/08/2024 8:35 AM MRN: [id number] Endoscopist: Lupita FORBES Commander , MD, [id number] Age: 68 Referring MD:  Date of Birth: 10-23-1956 Gender: Female Account #: [id number] Procedure:                Colonoscopy Indications:              Heme positive stool, Iron deficiency anemia Medicines:                Monitored Anesthesia Care Procedure:                Pre-Anesthesia Assessment:                           - Prior to the procedure, a History and Physical                            was performed, and patient medications and                            allergies were reviewed. The patient's tolerance of                            previous anesthesia was also reviewed. The risks                            and benefits of the procedure and the sedation                            options and risks were discussed with the patient.                            All questions were answered, and informed consent                            was obtained. Prior Anticoagulants: The patient has                            taken no anticoagulant or antiplatelet agents. ASA                            Grade Assessment: III - A patient with severe                            systemic disease. After reviewing the risks and                            benefits, the patient was deemed in satisfactory                            condition to undergo the procedure.                           After obtaining informed consent, the colonoscope  was passed under direct vision. Throughout the                            procedure, the patient's blood pressure, pulse, and                            oxygen saturations were monitored continuously. The                            PCF-HQ190L Colonoscope [id number] was introduced                            through the anus and advanced to the the cecum,                            identified by  appendiceal orifice and ileocecal                            valve. The colonoscopy was somewhat difficult due                            to fair prep. Successful completion of the                            procedure was aided by lavage. The patient                            tolerated the procedure well. The quality of the                            bowel preparation was fair. The ileocecal valve,                            appendiceal orifice, and rectum were photographed.                            The bowel preparation used was SUPREP via split                            dose instruction. Scope In: 9:04:11 AM Scope Out: 9:26:10 AM Scope Withdrawal Time: 0 hours 17 minutes 38 seconds  Total Procedure Duration: 0 hours 21 minutes 59 seconds  Findings:                 Hemorrhoids were found on perianal exam.                           External and internal hemorrhoids were found. The                            hemorrhoids were large.                           The exam was otherwise without abnormality on  direct and retroflexion views. Complications:            No immediate complications. Estimated Blood Loss:     Estimated blood loss: none. Impression:               - Preparation of the colon was fair.                           - Hemorrhoids found on perianal exam.                           - External and internal hemorrhoids on endoscopic                            exam                           - The examination was otherwise normal on direct                            and retroflexion views.                           - No specimens collected. Recommendation:           - Patient has a contact number available for                            emergencies. The signs and symptoms of potential                            delayed complications were discussed with the                            patient. Return to normal activities tomorrow.                             Written discharge instructions were provided to the                            patient.                           - Resume previous diet.                           - Continue present medications.                           - Repeat colonoscopy in 1 year.                           - Stert ferrous sulfate  325 mg qd her ferritin was                            16.  I think hemorrhoids were source of heme + stool                           Should repeat colonoscopy within 1 year (double                            prep) due to fair prep - I think with lavage no                            significant lesions missed but small polyps might                            not have been seen and last colooscopy w/ good prep                            2017 (no polyps) Lupita FORBES Commander, MD 08/08/2024 9:45:51 AM This report has been signed electronically.

## 2024-08-08 NOTE — Patient Instructions (Addendum)
 There was inflammation in the stomach that I biopsied to see if there was infection causing this. You have large internal hemorrhoids and I think that is why there was blood in the stool test. The colonoscopy cleanout is what we described as fair and technically you should repeat a colonoscopy in 1 year in the situation.  You should take an iron pill every day and I added that to your medication list is called ferrous sulfate .  Please follow-up with Dr. Onita regarding your anemia.  I will let you know what the biopsy results tell us .  I appreciate the opportunity to care for you. Kristi CHARLENA Commander, MD, [facility]  Resume all of your previous medications today as ordered. Read your discharge instructions. Be sure to take your iron everyday. You will need a repeat colonoscopy in 1 year.  YOU HAD AN ENDOSCOPIC PROCEDURE TODAY AT THE [facility]:   Refer to the procedure report that was given to you for any specific questions about what was found during the examination.  If the procedure report does not answer your questions, please call your gastroenterologist to clarify.  If you requested that your care partner not be given the details of your procedure findings, then the procedure report has been included in a sealed envelope for you to review at your convenience later.  YOU SHOULD EXPECT: Some feelings of bloating in the abdomen. Passage of more gas than usual.  Walking can help get rid of the air that was put into your GI tract during the procedure and reduce the bloating. If you had a lower endoscopy (such as a colonoscopy or flexible sigmoidoscopy) you may notice spotting of blood in your stool or on the toilet paper. If you underwent a bowel prep for your procedure, you may not have a normal bowel movement for a few days.  Please Note:  You might notice some irritation and congestion in your nose or some drainage.  This is from the oxygen used during your procedure.  There is no need  for concern and it should clear up in a day or so.  SYMPTOMS TO REPORT IMMEDIATELY:  Following lower endoscopy (colonoscopy or flexible sigmoidoscopy):  Excessive amounts of blood in the stool  Significant tenderness or worsening of abdominal pains  Swelling of the abdomen that is new, acute  Fever of 100F or higher  Following upper endoscopy (EGD)  Vomiting of blood or coffee ground material  New chest pain or pain under the shoulder blades  Painful or persistently difficult swallowing  New shortness of breath  Fever of 100F or higher  Black, tarry-looking stools  For urgent or emergent issues, a gastroenterologist can be reached at any hour by calling [phone]. Do not use MyChart messaging for urgent concerns.    DIET:  We do recommend a small (soft) meal at first, but then you may proceed to your regular diet.  Drink plenty of fluids but you should avoid alcoholic beverages for 24 hours.  ACTIVITY:  You should plan to take it easy for the rest of today and you should NOT DRIVE or use heavy machinery until tomorrow (because of the sedation medicines used during the test).    FOLLOW UP: Our staff will call the number listed on your records the next business day following your procedure.  We will call around 7:15- 8:00 am to check on you and address any questions or concerns that you may have regarding the information given to you following  your procedure. If we do not reach you, we will leave a message.     If any biopsies were taken you will be contacted by phone or by letter within the next 1-3 weeks.  Please call us  at [phone] if you have not heard about the biopsies in 3 weeks.    SIGNATURES/CONFIDENTIALITY: You and/or your care partner have signed paperwork which will be entered into your electronic medical record.  These signatures attest to the fact that that the information above on your After Visit Summary has been reviewed and is understood.  Full  responsibility of the confidentiality of this discharge information lies with you and/or your care-partner.

## 2024-08-09 ENCOUNTER — Telehealth: Payer: Self-pay | Admitting: *Deleted

## 2024-08-09 NOTE — Telephone Encounter (Signed)
  Follow up Call-     08/08/2024    7:43 AM  Call back number  Post procedure Call Back phone  # [phone]  Permission to leave phone message Yes     Patient questions:  Do you have a fever, pain , or abdominal swelling? No. Pain Score  0 *  Have you tolerated food without any problems? Yes.    Have you been able to return to your normal activities? Yes.    Do you have any questions about your discharge instructions: Diet   No. Medications  No. Follow up visit  No.  Do you have questions or concerns about your Care? No.  Actions: * If pain score is 4 or above: No action needed, pain <4.

## 2024-08-12 LAB — SURGICAL PATHOLOGY

## 2024-08-15 ENCOUNTER — Encounter (HOSPITAL_COMMUNITY)

## 2024-08-15 ENCOUNTER — Ambulatory Visit: Admitting: Vascular Surgery

## 2024-08-15 ENCOUNTER — Ambulatory Visit: Payer: Self-pay | Admitting: Internal Medicine

## 2024-09-23 ENCOUNTER — Other Ambulatory Visit: Payer: Self-pay

## 2024-09-23 ENCOUNTER — Telehealth: Payer: Self-pay

## 2024-09-23 DIAGNOSIS — D649 Anemia, unspecified: Secondary | ICD-10-CM

## 2024-09-23 NOTE — Telephone Encounter (Signed)
-----   Message from Nurse Daphne D sent at 07/19/2024  4:38 PM EDT ----- 07/19/2024: Labs show stable anemia, slightly improved from last labs.  Would plan to repeat CBC, iron/ferritin in a couple months.  Continue iron supplement for now.   Pt to come back in 2 months for repeat blood work. Orders need to be entered.

## 2024-09-23 NOTE — Telephone Encounter (Signed)
 Pt notified to come in for lab work. Orders placed. She states, she will come in by the end of the week.

## 2024-09-26 ENCOUNTER — Ambulatory Visit: Payer: Self-pay | Admitting: Gastroenterology

## 2024-09-26 ENCOUNTER — Other Ambulatory Visit

## 2024-09-26 DIAGNOSIS — D649 Anemia, unspecified: Secondary | ICD-10-CM | POA: Diagnosis not present

## 2024-09-26 LAB — CBC WITH DIFFERENTIAL/PLATELET
Basophils Absolute: 0.1 K/uL (ref 0.0–0.1)
Basophils Relative: 0.6 % (ref 0.0–3.0)
Eosinophils Absolute: 0.4 K/uL (ref 0.0–0.7)
Eosinophils Relative: 4.2 % (ref 0.0–5.0)
HCT: 36 % (ref 36.0–46.0)
Hemoglobin: 12 g/dL (ref 12.0–15.0)
Lymphocytes Relative: 28 % (ref 12.0–46.0)
Lymphs Abs: 2.3 K/uL (ref 0.7–4.0)
MCHC: 33.4 g/dL (ref 30.0–36.0)
MCV: 88 fl (ref 78.0–100.0)
Monocytes Absolute: 0.5 K/uL (ref 0.1–1.0)
Monocytes Relative: 6.3 % (ref 3.0–12.0)
Neutro Abs: 5.1 K/uL (ref 1.4–7.7)
Neutrophils Relative %: 60.9 % (ref 43.0–77.0)
Platelets: 380 K/uL (ref 150.0–400.0)
RBC: 4.09 Mil/uL (ref 3.87–5.11)
RDW: 14.6 % (ref 11.5–15.5)
WBC: 8.3 K/uL (ref 4.0–10.5)

## 2024-09-26 LAB — IBC + FERRITIN
Ferritin: 17.9 ng/mL (ref 10.0–291.0)
Iron: 112 ug/dL (ref 42–145)
Saturation Ratios: 38.8 % (ref 20.0–50.0)
TIBC: 288.4 ug/dL (ref 250.0–450.0)
Transferrin: 206 mg/dL — ABNORMAL LOW (ref 212.0–360.0)

## 2024-10-21 ENCOUNTER — Other Ambulatory Visit (HOSPITAL_COMMUNITY): Payer: Self-pay

## 2024-10-22 ENCOUNTER — Other Ambulatory Visit (HOSPITAL_COMMUNITY): Payer: Self-pay

## 2024-11-02 ENCOUNTER — Other Ambulatory Visit: Payer: Self-pay

## 2024-11-02 ENCOUNTER — Encounter (HOSPITAL_BASED_OUTPATIENT_CLINIC_OR_DEPARTMENT_OTHER): Payer: Self-pay

## 2024-11-02 ENCOUNTER — Emergency Department (HOSPITAL_BASED_OUTPATIENT_CLINIC_OR_DEPARTMENT_OTHER)
Admission: EM | Admit: 2024-11-02 | Discharge: 2024-11-02 | Disposition: A | Discharge status: Home / Self Care | Attending: Emergency Medicine | Admitting: Emergency Medicine

## 2024-11-02 DIAGNOSIS — I129 Hypertensive chronic kidney disease with stage 1 through stage 4 chronic kidney disease, or unspecified chronic kidney disease: Secondary | ICD-10-CM | POA: Insufficient documentation

## 2024-11-02 DIAGNOSIS — L309 Dermatitis, unspecified: Secondary | ICD-10-CM | POA: Diagnosis not present

## 2024-11-02 DIAGNOSIS — Z79899 Other long term (current) drug therapy: Secondary | ICD-10-CM | POA: Insufficient documentation

## 2024-11-02 DIAGNOSIS — Z7982 Long term (current) use of aspirin: Secondary | ICD-10-CM | POA: Diagnosis not present

## 2024-11-02 DIAGNOSIS — L299 Pruritus, unspecified: Secondary | ICD-10-CM | POA: Insufficient documentation

## 2024-11-02 DIAGNOSIS — E1165 Type 2 diabetes mellitus with hyperglycemia: Secondary | ICD-10-CM | POA: Insufficient documentation

## 2024-11-02 DIAGNOSIS — Z7984 Long term (current) use of oral hypoglycemic drugs: Secondary | ICD-10-CM | POA: Insufficient documentation

## 2024-11-02 DIAGNOSIS — N189 Chronic kidney disease, unspecified: Secondary | ICD-10-CM | POA: Insufficient documentation

## 2024-11-02 DIAGNOSIS — R21 Rash and other nonspecific skin eruption: Secondary | ICD-10-CM | POA: Diagnosis present

## 2024-11-02 LAB — CBG MONITORING, ED: Glucose-Capillary: 206 mg/dL — ABNORMAL HIGH (ref 70–99)

## 2024-11-02 MED ORDER — PREDNISONE 20 MG PO TABS: ORAL_TABLET | ORAL | 0 refills | Status: AC

## 2024-11-02 MED ORDER — HYDROXYZINE HCL 25 MG PO TABS: 25.0000 mg | ORAL_TABLET | Freq: Every evening | ORAL | 0 refills | Status: AC | PRN

## 2024-11-02 NOTE — Discharge Instructions (Signed)
 Please read and follow all provided instructions.  Your diagnoses today include:  1. Dermatitis   2. Itching    Tests performed today include: Blood sugar was elevated Vital signs. See below for your results today.   Medications prescribed:  Prednisone  - steroid medicine   It is best to take this medication in the morning to prevent sleeping problems. If you are diabetic, monitor your blood sugar closely and stop taking Prednisone  if blood sugar is persistently over 350. Take with food to prevent stomach upset.   Hydroxyzine  - antihistamine  You can find this medication over-the-counter.   This medication will make you drowsy. DO NOT drive or perform any activities that require you to be awake and alert if taking this.  Take any prescribed medications only as directed.  Home care instructions:  Follow any educational materials contained in this packet.  Please try to minimize what you are putting on the skin in case one of these products is contributing to the skin reaction.  I would recommend a hypoallergenic skin moisturizer only if possible.  Follow-up instructions: Please follow-up with your primary care provider in the next 7 days for further evaluation of your symptoms.  I would also advise you call your dermatologist for follow-up.  Return instructions:  Please return to the Emergency Department if you experience worsening symptoms. Return to the emergency department if your blood sugars are persistently above 350, you develop vomiting, rapid breathing, or abdominal pain. Please return if you have any other emergent concerns.  Additional Information:  Your vital signs today were: BP (!) 184/64 (BP Location: Right Arm)   Pulse 80   Temp 97.6 F (36.4 C) (Oral)   Resp 18   Ht 5' 1 (1.549 m)   Wt 58.1 kg   SpO2 100%   BMI 24.20 kg/m  If your blood pressure (BP) was elevated above 135/85 this visit, please have this repeated by your doctor within one  month. --------------

## 2024-11-02 NOTE — ED Triage Notes (Signed)
 Patient states dry itchy rash to most of body. States going on for 4 months. Hx of psoriasis.

## 2024-11-02 NOTE — ED Provider Notes (Signed)
 " [place] EMERGENCY DEPARTMENT AT [facility] Provider Note   CSN: [id number] Arrival date & time: 11/02/24  9047     Patient presents with: Rash   Kristi Avery is a 69 y.o. female.   With history of hypertension, diabetes, chronic kidney disease --presents to the emergency department with flaky skin, over her arms, legs, and torso.  This has been present for several months.  Patient reports intense itching at night.  No facial rash or intraoral rash.  No associated fevers.  She tries not to scratch the areas, but does at times.  She applies several topical products including CeraVe, other oils which help with itching.  She does report history of psoriasis.  She has not followed up with her dermatologist about this.  She has tried Benadryl  for itching but has not helped.  Denies any new recent medication changes.  No fevers or other infectious symptoms.       Prior to Admission medications  Medication Sig Start Date End Date Taking? Authorizing Provider  alendronate (FOSAMAX) 70 MG tablet Take 70 mg by mouth every Sunday. Take with a full glass of water on an empty stomach.    [provider]  amLODipine  (NORVASC ) 5 MG tablet Take 1 tablet (5 mg total) by mouth daily. 06/16/23 07/12/24  Christobal Guadalajara, MD  aspirin  EC 81 MG tablet Take 1 tablet (81 mg total) by mouth daily. Swallow whole. 06/15/23   Christobal Guadalajara, MD  atorvastatin  (LIPITOR) 80 MG tablet Take 1 tablet (80 mg total) by mouth daily. 07/12/24   Robins, Smitty Ackerley E, MD  Cholecalciferol (VITAMIN D3 PO) Take 1 capsule by mouth daily.    [provider]  clobetasol  ointment (TEMOVATE ) 0.05 % Apply 1 Application topically 2 (two) times daily.    [provider]  Cyanocobalamin  (VITAMIN B12 PO) Take 1 tablet by mouth daily.    [provider]  cyclobenzaprine  (FLEXERIL ) 5 MG tablet Take 5 mg by mouth 3 (three) times daily as needed for muscle spasms.    [provider]  ferrous sulfate  325  (65 FE) MG tablet Take 1 tablet (325 mg total) by mouth daily with breakfast. 08/08/24   Avram Lupita BRAVO, MD  folic acid  (FOLVITE ) 1 MG tablet Take 1,000 mcg by mouth. 07/22/24   [provider]  irbesartan  (AVAPRO ) 300 MG tablet Take 300 mg by mouth daily.    [provider]  metFORMIN  (GLUCOPHAGE ) 1000 MG tablet Take 500 mg by mouth daily with breakfast.    [provider]  Na Sulfate-K Sulfate-Mg Sulfate concentrate (SUPREP) 17.5-3.13-1.6 GM/177ML SOLN Take 1 kit (354 mLs total) by mouth as directed. 06/28/24   Heinz, Camie BRAVO, PA-C  pantoprazole  (PROTONIX ) 40 MG tablet Take 1 tablet (40 mg total) by mouth daily. (Take this in place of omeprazole  while on clopidogrel ) Patient taking differently: Take 40 mg by mouth daily. (Take this in place of omeprazole  while on clopidogrel ) takes as needed 06/14/23   Christobal Guadalajara, MD    Allergies: Rifampin, Tramadol, and Sulfonamide derivatives    Review of Systems  Updated Vital Signs BP (!) 184/64 (BP Location: Right Arm)   Pulse 80   Temp 97.6 F (36.4 C) (Oral)   Resp 18   Ht 5' 1 (1.549 m)   SpO2 100%   BMI 24.19 kg/m   Physical Exam Vitals and nursing note reviewed.  Constitutional:      Appearance: She is well-developed.  HENT:     Head:  Normocephalic and atraumatic.     Mouth/Throat:     Mouth: Mucous membranes are moist.     Pharynx: No oropharyngeal exudate.     Comments: No intraoral lesions. Eyes:     Conjunctiva/sclera: Conjunctivae normal.  Pulmonary:     Effort: No respiratory distress.  Musculoskeletal:     Cervical back: Normal range of motion and neck supple.  Skin:    General: Skin is warm and dry.     Comments: Patient with extensive skin flaking over the arms and legs.  Lower legs are inflamed but without any signs of localized cellulitis.  Excoriations are noted.  Flaking is less extensive and more fine on the torso.  Neurological:     Mental Status: She is alert.     (all labs ordered  are listed, but only abnormal results are displayed) Labs Reviewed  CBG MONITORING, ED - Abnormal; Notable for the following components:      Result Value   Glucose-Capillary 206 (*)    All other components within normal limits    EKG: None  Radiology: No results found.   Procedures   Medications Ordered in the ED - No data to display  ED Course  Patient seen and examined. History obtained directly from patient.   Labs/EKG: Ordered CBG  Imaging: None ordered  Medications/Fluids: None ordered  Most recent vital signs reviewed and are as follows: BP (!) 184/64 (BP Location: Right Arm)   Pulse 80   Temp 97.6 F (36.4 C) (Oral)   Resp 18   Ht 5' 1 (1.549 m)   SpO2 100%   BMI 24.19 kg/m   Initial impression: Extensive nonspecific dermatitis.  No evidence of cellulitis at this time.  Patient is concerned about the skin flaking and then associated itching which is causing her difficulty sleeping.  Home treatment plan: I encouraged her to minimize skin products, perhaps only using 1 hypoallergenic skin moisturizer if possible.  Will give steroid taper if blood sugar relatively controlled.  Discussed risks of using prednisone  in setting of diabetes and need to monitor blood sugar very closely.  Patient verbalizes understanding.  Will give hydroxyzine  for bedtime.  Return instructions discussed with patient: Fever, worsening pain/redness/swelling  Follow-up instructions discussed with patient: Strongly encouraged outpatient dermatology follow-up.  Patient is established.  She states that she will call on Monday.  Discussed limitations of diagnosis of skin conditions in the emergency department.                                   Medical Decision Making Risk Prescription drug management.   Patient with itchy rash, skin flaking, mostly over the extremities but also on the torso for several months.  This would not be classic for patient's previous diagnosis of psoriasis.   I do not see any evidence of infection today which would require antibiotics.  Patient looks well, nontoxic.  Do not suspect drug reaction.  Blood sugar here was elevated around 200.  After discussion with patient regarding the risks and benefits of prednisone  use in setting of diabetes, concern for development of high blood sugar and potentially DKA, will proceed with prednisone  use with close monitoring.  Patient instructed to discontinue if blood sugar is over 350 persistently.  Feel that the benefit, given the significant impairment in quality of life, is worth the risk at this time given that patient is able to monitor for symptoms closely  and seems reliable to return with worsening.  Again strongly encourage PCP/dermatology outpatient follow-up.     Final diagnoses:  Dermatitis  Itching    ED Discharge Orders          Ordered    predniSONE  (DELTASONE ) 20 MG tablet        11/02/24 1034    hydrOXYzine  (ATARAX ) 25 MG tablet  At bedtime PRN        11/02/24 1034               Desiderio Chew, PA-C 11/02/24 1038  "
